# Patient Record
Sex: Male | Born: 1970 | Race: Black or African American | Hispanic: No | Marital: Married | Smoking: Former smoker
Health system: Southern US, Community
[De-identification: ages and names within clinical notes are randomized; demographics above are authoritative.]

## PROBLEM LIST (undated history)

## (undated) DIAGNOSIS — G4730 Sleep apnea, unspecified: Secondary | ICD-10-CM

## (undated) DIAGNOSIS — F4310 Post-traumatic stress disorder, unspecified: Secondary | ICD-10-CM

## (undated) DIAGNOSIS — I517 Cardiomegaly: Secondary | ICD-10-CM

## (undated) DIAGNOSIS — S46009A Unspecified injury of muscle(s) and tendon(s) of the rotator cuff of unspecified shoulder, initial encounter: Secondary | ICD-10-CM

## (undated) DIAGNOSIS — F419 Anxiety disorder, unspecified: Secondary | ICD-10-CM

---

## 2011-08-08 ENCOUNTER — Other Ambulatory Visit: Payer: Self-pay | Admitting: Family Medicine

## 2011-08-08 DIAGNOSIS — M25511 Pain in right shoulder: Secondary | ICD-10-CM

## 2011-08-15 ENCOUNTER — Ambulatory Visit
Admission: RE | Admit: 2011-08-15 | Discharge: 2011-08-15 | Disposition: A | Discharge status: Home / Self Care | Payer: Non-veteran care | Source: Ambulatory Visit | Attending: Family Medicine | Admitting: Family Medicine

## 2011-08-15 DIAGNOSIS — M25511 Pain in right shoulder: Secondary | ICD-10-CM

## 2020-07-10 ENCOUNTER — Emergency Department (HOSPITAL_BASED_OUTPATIENT_CLINIC_OR_DEPARTMENT_OTHER): Payer: No Typology Code available for payment source

## 2020-07-10 ENCOUNTER — Encounter (HOSPITAL_BASED_OUTPATIENT_CLINIC_OR_DEPARTMENT_OTHER): Payer: Self-pay | Admitting: Emergency Medicine

## 2020-07-10 ENCOUNTER — Other Ambulatory Visit: Payer: Self-pay

## 2020-07-10 ENCOUNTER — Inpatient Hospital Stay (HOSPITAL_BASED_OUTPATIENT_CLINIC_OR_DEPARTMENT_OTHER)
Admission: EM | Admit: 2020-07-10 | Discharge: 2020-07-12 | DRG: 247 | Disposition: A | Discharge status: Home / Self Care | Payer: No Typology Code available for payment source | Attending: Internal Medicine | Admitting: Internal Medicine

## 2020-07-10 DIAGNOSIS — R030 Elevated blood-pressure reading, without diagnosis of hypertension: Secondary | ICD-10-CM

## 2020-07-10 DIAGNOSIS — Z20822 Contact with and (suspected) exposure to covid-19: Secondary | ICD-10-CM | POA: Diagnosis not present

## 2020-07-10 DIAGNOSIS — R079 Chest pain, unspecified: Secondary | ICD-10-CM | POA: Diagnosis not present

## 2020-07-10 DIAGNOSIS — I214 Non-ST elevation (NSTEMI) myocardial infarction: Secondary | ICD-10-CM | POA: Diagnosis not present

## 2020-07-10 DIAGNOSIS — F17290 Nicotine dependence, other tobacco product, uncomplicated: Secondary | ICD-10-CM | POA: Diagnosis present

## 2020-07-10 DIAGNOSIS — E785 Hyperlipidemia, unspecified: Secondary | ICD-10-CM | POA: Diagnosis present

## 2020-07-10 DIAGNOSIS — E669 Obesity, unspecified: Secondary | ICD-10-CM | POA: Diagnosis not present

## 2020-07-10 DIAGNOSIS — F419 Anxiety disorder, unspecified: Secondary | ICD-10-CM | POA: Diagnosis present

## 2020-07-10 DIAGNOSIS — E876 Hypokalemia: Secondary | ICD-10-CM | POA: Diagnosis not present

## 2020-07-10 DIAGNOSIS — Z955 Presence of coronary angioplasty implant and graft: Secondary | ICD-10-CM

## 2020-07-10 DIAGNOSIS — I10 Essential (primary) hypertension: Secondary | ICD-10-CM

## 2020-07-10 DIAGNOSIS — I119 Hypertensive heart disease without heart failure: Secondary | ICD-10-CM | POA: Diagnosis present

## 2020-07-10 DIAGNOSIS — Z8249 Family history of ischemic heart disease and other diseases of the circulatory system: Secondary | ICD-10-CM | POA: Diagnosis not present

## 2020-07-10 DIAGNOSIS — I2510 Atherosclerotic heart disease of native coronary artery without angina pectoris: Secondary | ICD-10-CM

## 2020-07-10 DIAGNOSIS — F4310 Post-traumatic stress disorder, unspecified: Secondary | ICD-10-CM | POA: Diagnosis present

## 2020-07-10 DIAGNOSIS — Z9989 Dependence on other enabling machines and devices: Secondary | ICD-10-CM

## 2020-07-10 DIAGNOSIS — I25110 Atherosclerotic heart disease of native coronary artery with unstable angina pectoris: Secondary | ICD-10-CM | POA: Diagnosis not present

## 2020-07-10 DIAGNOSIS — G4733 Obstructive sleep apnea (adult) (pediatric): Secondary | ICD-10-CM

## 2020-07-10 DIAGNOSIS — Z6841 Body Mass Index (BMI) 40.0 and over, adult: Secondary | ICD-10-CM | POA: Diagnosis not present

## 2020-07-10 DIAGNOSIS — R7303 Prediabetes: Secondary | ICD-10-CM | POA: Diagnosis present

## 2020-07-10 HISTORY — DX: Sleep apnea, unspecified: G47.30

## 2020-07-10 HISTORY — DX: Cardiomegaly: I51.7

## 2020-07-10 HISTORY — DX: Unspecified injury of muscle(s) and tendon(s) of the rotator cuff of unspecified shoulder, initial encounter: S46.009A

## 2020-07-10 HISTORY — DX: Post-traumatic stress disorder, unspecified: F43.10

## 2020-07-10 HISTORY — DX: Anxiety disorder, unspecified: F41.9

## 2020-07-10 LAB — BASIC METABOLIC PANEL
Anion gap: 9 (ref 5–15)
BUN: 10 mg/dL (ref 6–20)
CO2: 24 mmol/L (ref 22–32)
Calcium: 8.9 mg/dL (ref 8.9–10.3)
Chloride: 105 mmol/L (ref 98–111)
Creatinine, Ser: 1.13 mg/dL (ref 0.61–1.24)
GFR calc Af Amer: >60 mL/min (ref >60)
GFR calc non Af Amer: >60 mL/min (ref >60)
Glucose, Bld: 118 mg/dL — ABNORMAL HIGH (ref 70–99)
Potassium: 4.1 mmol/L (ref 3.5–5.1)
Sodium: 138 mmol/L (ref 135–145)

## 2020-07-10 LAB — TROPONIN I (HIGH SENSITIVITY)
Troponin I (High Sensitivity): 3197 ng/L (ref <18)
Troponin I (High Sensitivity): 4141 ng/L (ref <18)
Troponin I (High Sensitivity): 4979 ng/L (ref <18)
Troponin I (High Sensitivity): 5323 ng/L (ref <18)

## 2020-07-10 LAB — CBC
HCT: 47.9 % (ref 39.0–52.0)
Hemoglobin: 16.1 g/dL (ref 13.0–17.0)
MCH: 30.6 pg (ref 26.0–34.0)
MCHC: 33.6 g/dL (ref 30.0–36.0)
MCV: 90.9 fL (ref 80.0–100.0)
Platelets: 198 10*3/uL (ref 150–400)
RBC: 5.27 MIL/uL (ref 4.22–5.81)
RDW: 13.7 % (ref 11.5–15.5)
WBC: 10.2 10*3/uL (ref 4.0–10.5)
nRBC: 0 % (ref 0.0–0.2)

## 2020-07-10 LAB — HEPARIN LEVEL (UNFRACTIONATED): Heparin Unfractionated: 0.35 IU/mL (ref 0.30–0.70)

## 2020-07-10 LAB — SARS CORONAVIRUS 2 BY RT PCR (HOSPITAL ORDER, PERFORMED IN ~~LOC~~ HOSPITAL LAB): SARS Coronavirus 2: NEGATIVE

## 2020-07-10 MED ORDER — ATORVASTATIN CALCIUM 80 MG PO TABS
80.0000 mg | ORAL_TABLET | Freq: Every day | ORAL | Status: DC
  Administered 2020-07-10 – 2020-07-11 (×2): 80 mg via ORAL
  Filled 2020-07-10 (×2): qty 1

## 2020-07-10 MED ORDER — MORPHINE SULFATE (PF) 4 MG/ML IV SOLN
4.0000 mg | Freq: Once | INTRAVENOUS | Status: DC
  Filled 2020-07-10: qty 1

## 2020-07-10 MED ORDER — LORAZEPAM 1 MG PO TABS
1.0000 mg | ORAL_TABLET | Freq: Once | ORAL | Status: AC
  Administered 2020-07-10: 1 mg via ORAL
  Filled 2020-07-10: qty 1

## 2020-07-10 MED ORDER — LABETALOL HCL 5 MG/ML IV SOLN
10.0000 mg | INTRAVENOUS | Status: DC | PRN
  Administered 2020-07-10: 10 mg via INTRAVENOUS
  Filled 2020-07-10: qty 4

## 2020-07-10 MED ORDER — ASPIRIN 81 MG PO CHEW
324.0000 mg | CHEWABLE_TABLET | Freq: Once | ORAL | Status: AC
  Administered 2020-07-10: 324 mg via ORAL
  Filled 2020-07-10: qty 4

## 2020-07-10 MED ORDER — ONDANSETRON HCL 4 MG/2ML IJ SOLN: 4.0000 mg | Freq: Four times a day (QID) | INTRAMUSCULAR | Status: DC | PRN

## 2020-07-10 MED ORDER — HEPARIN BOLUS VIA INFUSION
4000.0000 [IU] | Freq: Once | INTRAVENOUS | Status: AC
  Administered 2020-07-10: 4000 [IU] via INTRAVENOUS

## 2020-07-10 MED ORDER — ASPIRIN EC 81 MG PO TBEC
81.0000 mg | DELAYED_RELEASE_TABLET | Freq: Every day | ORAL | Status: DC
  Administered 2020-07-11: 81 mg via ORAL
  Filled 2020-07-10: qty 1

## 2020-07-10 MED ORDER — LORAZEPAM 0.5 MG PO TABS
0.5000 mg | ORAL_TABLET | Freq: Two times a day (BID) | ORAL | Status: DC | PRN
  Administered 2020-07-11: 0.5 mg via ORAL
  Filled 2020-07-10: qty 1

## 2020-07-10 MED ORDER — ACETAMINOPHEN 325 MG PO TABS
650.0000 mg | ORAL_TABLET | Freq: Once | ORAL | Status: AC
  Administered 2020-07-10: 650 mg via ORAL
  Filled 2020-07-10: qty 2

## 2020-07-10 MED ORDER — ACETAMINOPHEN 325 MG PO TABS: 650.0000 mg | ORAL_TABLET | ORAL | Status: DC | PRN

## 2020-07-10 MED ORDER — BLISTEX MEDICATED EX OINT
TOPICAL_OINTMENT | CUTANEOUS | Status: AC
  Administered 2020-07-10: 1 via TOPICAL
  Filled 2020-07-10: qty 6.3

## 2020-07-10 MED ORDER — NITROGLYCERIN 0.4 MG SL SUBL
0.4000 mg | SUBLINGUAL_TABLET | SUBLINGUAL | Status: DC | PRN
  Administered 2020-07-10 (×3): 0.4 mg via SUBLINGUAL
  Filled 2020-07-10: qty 1

## 2020-07-10 MED ORDER — NITROGLYCERIN IN D5W 200-5 MCG/ML-% IV SOLN
0.0000 ug/min | INTRAVENOUS | Status: DC
  Administered 2020-07-10: 5 ug/min via INTRAVENOUS
  Filled 2020-07-10: qty 250

## 2020-07-10 MED ORDER — HEPARIN (PORCINE) 25000 UT/250ML-% IV SOLN
1500.0000 [IU]/h | INTRAVENOUS | Status: DC
  Administered 2020-07-10: 1300 [IU]/h via INTRAVENOUS
  Administered 2020-07-11: 1500 [IU]/h via INTRAVENOUS
  Filled 2020-07-10 (×2): qty 250

## 2020-07-10 MED ORDER — LABETALOL HCL 5 MG/ML IV SOLN: 10.0000 mg | Freq: Once | INTRAVENOUS | Status: DC

## 2020-07-10 MED ORDER — BLISTEX MEDICATED EX OINT: TOPICAL_OINTMENT | CUTANEOUS | Status: DC | PRN

## 2020-07-10 MED ORDER — CARVEDILOL 3.125 MG PO TABS
3.1250 mg | ORAL_TABLET | Freq: Two times a day (BID) | ORAL | Status: DC
  Administered 2020-07-11: 3.125 mg via ORAL
  Filled 2020-07-10: qty 1

## 2020-07-10 NOTE — Progress Notes (Signed)
ANTICOAGULATION CONSULT NOTE - Initial Consult  Pharmacy Consult for heparin Indication: chest pain/ACS  No Known Allergies  Patient Measurements: Height: 5\' 10"  (177.8 cm) Weight: 129.3 kg (285 lb) IBW/kg (Calculated) : 73 Heparin Dosing Weight: 102.7kg  Vital Signs: Temp: 98.6 F (37 C) (08/09 0832) Temp Source: Oral (08/09 0832) BP: 140/97 (08/09 1000) Pulse Rate: 44 (08/09 1000)  Labs: Recent Labs    07/10/20 0907  HGB 16.1  HCT 47.9  PLT 198  CREATININE 1.13  TROPONINIHS 3,197*    Estimated Creatinine Clearance: 105.6 mL/min (by C-G formula based on SCr of 1.13 mg/dL).   Medical History: Past Medical History:  Diagnosis Date  . Anxiety   . Enlarged heart   . PTSD (post-traumatic stress disorder)   . Rotator cuff injury   . Sleep apnea     Medications:  Infusions:  . heparin      Assessment: 48 yom presented to the ED with CP. Troponin elevated and now starting IV heparin. Baseline CBC is WNL. He is not on anticoagulation PTA.   Goal of Therapy:  Heparin level 0.3-0.7 units/ml Monitor platelets by anticoagulation protocol: Yes   Plan:  Heparin bolus 4000 units IV x 1 Heparin gtt 1300 units/hr Check a 6 hr heparin level Daily heparin level and CBC  Colin Stewart, 12-22-1978 07/10/2020,10:13 AM

## 2020-07-10 NOTE — Progress Notes (Signed)
Pt placed on cpap for the night. °

## 2020-07-10 NOTE — H&P (Signed)
ADMISSION HISTORY & PHYSICAL  Patient Name: Colin Stewart Date of Encounter: 07/10/2020 Primary Care Physician: [facility] Cardiologist: No primary care provider on file.  Chief Complaint   Chest pain  Patient Profile   49 yo male with "no medical problems" presents with several days of chest burning and pressure, found to have elevated troponin c/w NSTEMI  HPI   This is a 49 y.o. male  [place] with a past medical history significant for enlarged heart, PTSD and sleep apnea, presents with several days of progressive chest burning and pressure.  He reports has been having symptoms on and off possibly for months including left arm numbness as well as some numbness and burning in his legs particularly after he walks several blocks that improves after rest.  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  He said he was smoking a cigar at the time and put that down but had no instant relief from the symptoms.  Then it seemed to go away over the weekend and came back again irritated by smoking.  He then stopped with the cigars and returned to [place] but could not sleep and had continued chest pressure ultimately presented to med [facility].  He was evaluated there abnormal elevation in high-sensitivity troponin with values of 3197, 4141, 4979, and 5323.  He was started on IV heparin for presumed non-STEMI and cardiology was consulted.  Covid testing was negative, however he is unvaccinated.  He was also noted to be markedly hypertensive which she says is unusual for him.  Most recently blood pressure was one 160/106.  Labs were otherwise unremarkable.  Chest x-ray shows no acute cardiopulmonary disease.  EKG personally reviewed shows sinus rhythm at 68 with probable left atrial enlargement and no ischemic changes.  PMHx   Past Medical History:  Diagnosis Date  . Anxiety   . Enlarged heart   . PTSD (post-traumatic stress disorder)   .  Rotator cuff injury   . Sleep apnea     History reviewed. No pertinent surgical history.  FAMHx   Family History  Problem Relation Age of Onset  . CAD Mother   . Hypertension Father     SOCHx    reports that he has been smoking cigars. He has never used smokeless tobacco. He reports current alcohol use of about 6.0 standard drinks of alcohol per week. He reports current drug use. Frequency: 1.00 time per week. Drug: Marijuana.  Previously smoked cigarettes on a daily basis but quit about a year ago  Outpatient Medications   No current facility-administered medications on file prior to encounter.   No current outpatient medications on file prior to encounter.    Inpatient Medications    Scheduled Meds: .  morphine injection  4 mg Intravenous Once    Continuous Infusions: . heparin 1,300 Units/hr (07/10/20 1043)    PRN Meds: labetalol, lip balm, nitroGLYCERIN   ALLERGIES   No Known Allergies  ROS   Pertinent items noted in HPI and remainder of comprehensive ROS otherwise negative.  Vitals   Vitals:   07/10/20 1545 07/10/20 1607 07/10/20 1642 07/10/20 1735  BP: (!) 173/112 (!) 165/107 (!) 154/112 (!) 160/106  Pulse: 65  94 77  Resp: 16 15 16 16   Temp:    98.4 F (36.9 C)  TempSrc:    Oral  SpO2: 100%  99% 100%  Weight:    122.5 kg  Height:  5\' 10"  (1.778 m)    Intake/Output Summary (Last 24 hours) at 07/10/2020 2007 Last data filed at 07/10/2020 1800 Gross per 24 hour  Intake 132.63 ml  Output --  Net 132.63 ml   Filed Weights   07/10/20 09/09/20 07/10/20 1735  Weight: 129.3 kg 122.5 kg    Physical Exam   General appearance: alert, no distress and moderately obese Neck: no carotid bruit, no JVD and thyroid not enlarged, symmetric, no tenderness/mass/nodules Lungs: diminished breath sounds bilaterally and rhonchi bilaterally Heart: regular rate and rhythm Abdomen: soft, non-tender; bowel sounds normal; no masses,  no organomegaly Extremities:  extremities normal, atraumatic, no cyanosis or edema Pulses: 2+ and symmetric Skin: Skin color, texture, turgor normal. No rashes or lesions Neurologic: Grossly normal Psych: Pleasant  Labs   Results for orders placed or performed during the hospital encounter of 07/10/20 (from the past 48 hour(s))  Basic metabolic panel     Status: Abnormal   Collection Time: 07/10/20  9:07 AM  Result Value Ref Range   Sodium 138 135 - 145 mmol/L   Potassium 4.1 3.5 - 5.1 mmol/L   Chloride 105 98 - 111 mmol/L   CO2 24 22 - 32 mmol/L   Glucose, Bld 118 (H) 70 - 99 mg/dL    Comment: Glucose reference range applies only to samples taken after fasting for at least 8 hours.   BUN 10 6 - 20 mg/dL   Creatinine, Ser 09/09/20 0.61 - 1.24 mg/dL   Calcium 8.9 8.9 - 9.32 mg/dL   GFR calc non Af Amer >60 >60 mL/min   GFR calc Af Amer >60 >60 mL/min   Anion gap 9 5 - 15    Comment: Performed at [facility], [address] [place], [place]  CBC     Status: None   Collection Time: 07/10/20  9:07 AM  Result Value Ref Range   WBC 10.2 4.0 - 10.5 K/uL   RBC 5.27 4.22 - 5.81 MIL/uL   Hemoglobin 16.1 13.0 - 17.0 g/dL   HCT 09/09/20 39 - 52 %   MCV 90.9 80.0 - 100.0 fL   MCH 30.6 26.0 - 34.0 pg   MCHC 33.6 30.0 - 36.0 g/dL   RDW 99.8 33.8 - 25.0 %   Platelets 198 150 - 400 K/uL   nRBC 0.0 0.0 - 0.2 %    Comment: Performed at [facility], [address] [place], [place]  Troponin I (High Sensitivity)     Status: Abnormal   Collection Time: 07/10/20  9:07 AM  Result Value Ref Range   Troponin I (High Sensitivity) 3,197 (HH) <18 ng/L    Comment: CRITICAL RESULT CALLED TO, READ BACK BY AND VERIFIED WITH: CALLED TO C.REED RN AT 1000 ON 09/09/20 BY SROY (NOTE) Elevated high sensitivity troponin I (hsTnI) values and significant  changes across serial measurements may suggest ACS but many other  chronic and acute conditions are known to elevate hsTnI results.  Refer to the  Links section for chest pain algorithms and additional  guidance. Performed at [facility], [address] [place], [place]   Troponin I (High Sensitivity)     Status: Abnormal   Collection Time: 07/10/20 10:44 AM  Result Value Ref Range   Troponin I (High Sensitivity) 4,141 (HH) <18 ng/L    Comment: CRITICAL RESULT CALLED TO, READ BACK BY AND VERIFIED WITH: MARVA SIMMS RN @1129  07/10/2020 OLSONM DELTA  CHECK NOTED (NOTE) Elevated high sensitivity troponin I (hsTnI) values and significant  changes across serial measurements may suggest ACS but many other  chronic and acute conditions are known to elevate hsTnI results.  Refer to the Links section for chest pain algorithms and additional  guidance. Performed at [facility], [address] [place], [place] [address]   SARS Coronavirus 2 by RT PCR (hospital order, performed in [facility] hospital lab) Nasopharyngeal Nasopharyngeal Swab     Status: None   Collection Time: 07/10/20 10:44 AM   Specimen: Nasopharyngeal Swab  Result Value Ref Range   SARS Coronavirus 2 NEGATIVE NEGATIVE    Comment: (NOTE) SARS-CoV-2 target nucleic acids are NOT DETECTED.  The SARS-CoV-2 RNA is generally detectable in upper and lower respiratory specimens during the acute phase of infection. The lowest concentration of SARS-CoV-2 viral copies this assay can detect is 250 copies / mL. A negative result does not preclude SARS-CoV-2 infection and should not be used as the sole basis for treatment or other patient management decisions.  A negative result may occur with improper specimen collection / handling, submission of specimen other than nasopharyngeal swab, presence of viral mutation(s) within the areas targeted by this assay, and inadequate number of viral copies (<250 copies / mL). A negative result must be combined with clinical observations, patient history, and epidemiological information.  Fact Sheet for  Patients:   BoilerBrush.com.cy  Fact Sheet for Healthcare Providers: https://pope.com/  This test is not yet approved or  cleared by the [place] FDA and has been authorized for detection and/or diagnosis of SARS-CoV-2 by FDA under an Emergency Use Authorization (EUA).  This EUA will remain in effect (meaning this test can be used) for the duration of the COVID-19 declaration under Section 564(b)(1) of the Act, 21 U.S.C. section 360bbb-3(b)(1), unless the authorization is terminated or revoked sooner.  Performed at [facility], [address] [place], [place] [address]   Troponin I (High Sensitivity)     Status: Abnormal   Collection Time: 07/10/20  1:40 PM  Result Value Ref Range   Troponin I (High Sensitivity) 4,979 (HH) <18 ng/L    Comment: CRITICAL RESULT CALLED TO, READ BACK BY AND VERIFIED WITH: MARVA SIMMS RN @1421  07/10/2020 OLSONM DELTA CHECK NOTED (NOTE) Elevated high sensitivity troponin I (hsTnI) values and significant  changes across serial measurements may suggest ACS but many other  chronic and acute conditions are known to elevate hsTnI results.  Refer to the Links section for chest pain algorithms and additional  guidance. Performed at [facility], [address] [place], [place]   Troponin I (High Sensitivity)     Status: Abnormal   Collection Time: 07/10/20  3:18 PM  Result Value Ref Range   Troponin I (High Sensitivity) 5,323 (HH) <18 ng/L    Comment: CRITICAL RESULT CALLED TO, READ BACK BY AND VERIFIED WITH: MARVA SIMMS RN @1556  07/10/2020 OLSONM DELTA CHECK NOTED (NOTE) Elevated high sensitivity troponin I (hsTnI) values and significant  changes across serial measurements may suggest ACS but many other  chronic and acute conditions are known to elevate hsTnI results.  Refer to the Links section for chest pain algorithms and additional  guidance. Performed at [facility], [address] [place], [address] [place]   Heparin level (unfractionated)     Status: None   Collection Time: 07/10/20  4:26 PM  Result Value Ref Range  Heparin Unfractionated 0.35 0.30 - 0.70 IU/mL    Comment: (NOTE) If heparin results are below expected values, and patient dosage has  been confirmed, suggest follow up testing of antithrombin III levels. Performed at [facility], [address] [place], [place] [address]     ECG   Sinus rhythm at 68, possible left atrial enlargement- Personally Reviewed  Telemetry   Sinus rhythm- Personally Reviewed  Radiology   DG Chest 2 View  Result Date: 07/10/2020 CLINICAL DATA:  Chest pain EXAM: CHEST - 2 VIEW COMPARISON:  None. FINDINGS: The heart size and mediastinal contours are within normal limits. Both lungs are clear. The visualized skeletal structures are unremarkable. IMPRESSION: No active cardiopulmonary disease. Electronically Signed   By: Alcide CleverMark  Lukens M.D.   On: 07/10/2020 09:02    Cardiac Studies   None  Assessment   Active Problems:   NSTEMI (non-ST elevated myocardial infarction) (HCC)   Blood pressure elevated without history of HTN   OSA on CPAP   Plan   1. NSTEMI -Significant troponin elevation suggestive of non-ST  elevation MI.  His symptoms have been going on for several days.  At this point he reports 2 out of 10 burning chest pressure however his pain was relieved after nitroglycerin.  Recommend restarting nitroglycerin drip.  He is on IV heparin.  Continue aspirin, start beta-blocker and continue to monitor on telemetry.  We discussed definitive heart catheterization I recommend that tomorrow.  Keep n.p.o. after midnight.  2. Elevated blood pressure -He reports no history of hypertension or diagnosis of that.  This may be related to acute coronary syndrome.  As above start IV nitroglycerin for chest discomfort and elevated blood pressure as well as beta-blocker for  goal-directed therapy.  3. OSA on CPAP -Resume home CPAP per RT, he does not know his settings but reported mild apnea with an AHI of 2.5?  4.   Screening -Check fasting lipid profile hemoglobin A1c in the a.m. start high potency statin therapy for guidelines for ACS  Time Spent Directly with Patient:  I have spent a total of 45 minutes with patient reviewing hospital notes, telemetry, EKGs, labs and examining the patient as well as establishing an assessment and plan that was discussed with the patient.  > 50% of time was spent in direct patient care.   Length of Stay:  LOS: 0 days   Chrystie NoseKenneth C. Ulas Zuercher, MD, Citizens Medical CenterFACC, FACP  [place]  [facility]  Medical Director of the [facility]  [facility] Diplomate of the American Board of Clinical Lipidology Attending Cardiologist  Direct Dial: [phone]  Fax: [phone]  Website:  [email or web address]   Vika Buske C Idolina Mantell 07/10/2020, 8:07 PM

## 2020-07-10 NOTE — ED Triage Notes (Signed)
Intermittent chest pain since night before last.  Pt states constant since last night.  No sob.  Some nausea.  No vomiting.  Some left arm tingling for several months.

## 2020-07-10 NOTE — ED Notes (Signed)
Date and time results received: 07/10/20 1001   Test: trp Critical OVPCH:4035  Name of Provider Notified: Charm Barges Orders Received? Or Actions Taken?: no orders given

## 2020-07-10 NOTE — ED Provider Notes (Signed)
[facility] EMERGENCY DEPARTMENT Provider Note   CSN: [id number] Arrival date & time: 07/10/20  6503     History Chief Complaint  Patient presents with  . Chest Pain    Colin Stewart is a 49 y.o. male.  He is here with a complaint of substernal chest pressure.  Since its been going on for a few days on and off but more constant since last night.  He associates it with smoking cigars as it started with a burning feeling in his lungs.  Some nausea no vomiting.  No shortness of breath.  No diaphoresis.  Also notes his blood pressure is high but this is a new problem.  History of high blood pressure and heart issues in his family.  Follows with the [place].  The history is provided by the patient.  Chest Pain Pain location:  Substernal area Pain quality: pressure   Pain radiates to:  Does not radiate Pain severity:  Moderate Onset quality:  Gradual Timing:  Intermittent Progression:  Worsening Chronicity:  New Relieved by:  Nothing Worsened by:  Nothing Ineffective treatments:  None tried Associated symptoms: abdominal pain (subxiphoid), heartburn and numbness (intermittent arms)   Associated symptoms: no back pain, no fever, no shortness of breath and no syncope   Risk factors: male sex and smoking   Risk factors: no coronary artery disease and no diabetes mellitus     HPI: A 49 year old patient with a history of obesity presents for evaluation of chest pain. Initial onset of pain was more than 6 hours ago. The patient's chest pain is described as heaviness/pressure/tightness and is not worse with exertion. The patient complains of nausea. The patient's chest pain is middle- or left-sided, is not well-localized, is not sharp and does not radiate to the arms/jaw/neck. The patient denies diaphoresis. The patient has smoked in the past 90 days and has a family history of coronary artery disease in a first-degree relative with onset less than age 44. The patient has no history of  stroke, has no history of peripheral artery disease, denies any history of treated diabetes, is not hypertensive and has no history of hypercholesterolemia.   Past Medical History:  Diagnosis Date  . Anxiety   . Enlarged heart   . PTSD (post-traumatic stress disorder)   . Rotator cuff injury     There are no problems to display for this patient.   History reviewed. No pertinent surgical history.     No family history on file.  Social History   Tobacco Use  . Smoking status: Current Some Day Smoker    Types: Cigars  . Smokeless tobacco: Never Used  Vaping Use  . Vaping Use: Never used  Substance Use Topics  . Alcohol use: Not on file  . Drug use: Not on file    Home Medications Prior to Admission medications   Not on File    Allergies    Patient has no known allergies.  Review of Systems   Review of Systems  Constitutional: Negative for fever.  HENT: Negative for sore throat.   Eyes: Negative for visual disturbance.  Respiratory: Negative for shortness of breath.   Cardiovascular: Positive for chest pain. Negative for syncope.  Gastrointestinal: Positive for abdominal pain (subxiphoid) and heartburn.  Genitourinary: Negative for dysuria.  Musculoskeletal: Negative for back pain.  Skin: Negative for rash.  Neurological: Positive for numbness (intermittent arms).    Physical Exam Updated Vital Signs BP (!) 156/119   Pulse 74  Temp 98.6 F (37 C) (Oral)   Resp 16   Ht 5\' 10"  (1.778 m)   Wt 129.3 kg   SpO2 100%   BMI 40.89 kg/m   Physical Exam Vitals and nursing note reviewed.  Constitutional:      Appearance: Normal appearance. He is well-developed.  HENT:     Head: Normocephalic and atraumatic.  Eyes:     Conjunctiva/sclera: Conjunctivae normal.  Cardiovascular:     Rate and Rhythm: Normal rate and regular rhythm.     Heart sounds: No murmur heard.   Pulmonary:     Effort: Pulmonary effort is normal. No respiratory distress.     Breath  sounds: Normal breath sounds.  Abdominal:     Palpations: Abdomen is soft.     Tenderness: There is no abdominal tenderness.  Musculoskeletal:        General: Normal range of motion.     Cervical back: Neck supple.     Right lower leg: No edema.     Left lower leg: No edema.  Skin:    General: Skin is warm and dry.     Capillary Refill: Capillary refill takes less than 2 seconds.  Neurological:     General: No focal deficit present.     Mental Status: He is alert.     Sensory: No sensory deficit.     Motor: No weakness.     ED Results / Procedures / Treatments   Labs (all labs ordered are listed, but only abnormal results are displayed) Labs Reviewed  BASIC METABOLIC PANEL - Abnormal; Notable for the following components:      Result Value   Glucose, Bld 118 (*)    All other components within normal limits  TROPONIN I (HIGH SENSITIVITY) - Abnormal; Notable for the following components:   Troponin I (High Sensitivity) 3,197 (*)    All other components within normal limits  TROPONIN I (HIGH SENSITIVITY) - Abnormal; Notable for the following components:   Troponin I (High Sensitivity) 4,141 (*)    All other components within normal limits  TROPONIN I (HIGH SENSITIVITY) - Abnormal; Notable for the following components:   Troponin I (High Sensitivity) 4,979 (*)    All other components within normal limits  SARS CORONAVIRUS 2 BY RT PCR (HOSPITAL ORDER, PERFORMED IN [facility])  CBC  HEPARIN LEVEL (UNFRACTIONATED)  TROPONIN I (HIGH SENSITIVITY)    EKG EKG Interpretation  Date/Time:  Monday July 10 2020 08:32:17 EDT Ventricular Rate:  68 PR Interval:    QRS Duration: 102 QT Interval:  394 QTC Calculation: 419 R Axis:   25 Text Interpretation: Sinus rhythm Probable left atrial enlargement RSR' in V1 or V2, probably normal variant No old tracing to compare Confirmed by Meridee ScoreButler, Angelli Baruch [id number]) on 07/10/2020 8:42:45 AM   Radiology DG Chest 2 View  Result  Date: 07/10/2020 CLINICAL DATA:  Chest pain EXAM: CHEST - 2 VIEW COMPARISON:  None. FINDINGS: The heart size and mediastinal contours are within normal limits. Both lungs are clear. The visualized skeletal structures are unremarkable. IMPRESSION: No active cardiopulmonary disease. Electronically Signed   By: Alcide CleverMark  Lukens M.D.   On: 07/10/2020 09:02    Procedures .Critical Care Performed by: Terrilee FilesButler, Jelan Batterton C, MD Authorized by: Terrilee FilesButler, Liane Tribbey C, MD   Critical care provider statement:    Critical care time (minutes):  45   Critical care time was exclusive of:  Separately billable procedures and treating other patients   Critical care was necessary  to treat or prevent imminent or life-threatening deterioration of the following conditions:  Cardiac failure   Critical care was time spent personally by me on the following activities:  Discussions with consultants, evaluation of patient's response to treatment, examination of patient, ordering and performing treatments and interventions, ordering and review of laboratory studies, ordering and review of radiographic studies, pulse oximetry, re-evaluation of patient's condition, obtaining history from patient or surrogate, review of old charts and development of treatment plan with patient or surrogate   I assumed direction of critical care for this patient from another provider in my specialty: no     (including critical care time)  Medications Ordered in ED Medications  nitroGLYCERIN (NITROSTAT) SL tablet 0.4 mg (0.4 mg Sublingual Given 07/10/20 0920)  heparin ADULT infusion 100 units/mL (25000 units/212mL sodium chloride 0.45%) (1,300 Units/hr Intravenous New Bag/Given 07/10/20 1043)  lip balm (BLISTEX) ointment (1 application Topical Given 07/10/20 1127)  morphine 4 MG/ML injection 4 mg (4 mg Intravenous Not Given 07/10/20 1334)  aspirin chewable tablet 324 mg (324 mg Oral Given 07/10/20 0906)  acetaminophen (TYLENOL) tablet 650 mg (650 mg Oral Given 07/10/20  0926)  heparin bolus via infusion 4,000 Units (4,000 Units Intravenous Bolus from Bag 07/10/20 1043)  LORazepam (ATIVAN) tablet 1 mg (1 mg Oral Given 07/10/20 1331)    ED Course  I have reviewed the triage vital signs and the nursing notes.  Pertinent labs & imaging results that were available during my care of the patient were reviewed by me and considered in my medical decision making (see chart for details).  Clinical Course as of Jul 11 1447  Mon Jul 10, 2020  1017 Troponin markedly elevated at 3200.  Discussed with cardiology on-call Dr. Antoine Poche.  He will be admitting Dr. over at [facility] telemetry obs.  Agrees with current management of heparin and aspirin.  Patient updated on results and agreeable to plan.   [MB]  1056 Patient currently pain-free.  Very anxious about needing to be admitted.   [MB]    Clinical Course User Index [MB] Terrilee Files, MD   MDM Rules/Calculators/A&P HEAR Score: 4                       This patient complains of chest pain substernal burning in nature; this involves an extensive number of treatment Options and is a complaint that carries with it a high risk of complications and Morbidity. The differential includes ACS, vascular, PE, dissection, pneumothorax, pneumonia, reflux, musculoskeletal  I ordered, reviewed and interpreted labs, which included CBC with normal white count normal hemoglobin, chemistries normal other than mildly elevated glucose, troponin elevated and rising, Covid testing negative I ordered medication aspirin, sublingual nitroglycerin, IV heparin I ordered imaging studies which included chest x-ray and I independently    visualized and interpreted imaging which showed no acute disease Additional history obtained from patient's wife Previous records obtained and reviewed in epic, none I consulted cardiology Dr. Antoine Poche and discussed lab and imaging findings  Critical Interventions: Identification of NSTEMI and treatment with aspirin  IV heparin and sublingual nitroglycerin.  After the interventions stated above, I reevaluated the patient and found patient to be pain-free at this time.  He is complaining of some more chronic back and leg pain from sitting in the stretcher.  He understands he will need to be admitted to the hospital over at [facility] for cardiology evaluation and likely cardiac cath.   Final Clinical Impression(s) / ED  Diagnoses Final diagnoses:  NSTEMI (non-ST elevated myocardial infarction) [facility])    Rx / DC Orders ED Discharge Orders    None       Terrilee Files, MD 07/10/20 2100

## 2020-07-10 NOTE — Progress Notes (Signed)
ANTICOAGULATION CONSULT NOTE Pharmacy Consult for heparin Indication: chest pain/ACS  No Known Allergies  Patient Measurements: Height: 5\' 10"  (177.8 cm) Weight: 122.5 kg (270 lb 1.6 oz) IBW/kg (Calculated) : 73 Heparin Dosing Weight: 102.7kg  Vital Signs: Temp: 98.4 F (36.9 C) (08/09 1735) Temp Source: Oral (08/09 1735) BP: 160/106 (08/09 1735) Pulse Rate: 77 (08/09 1735)  Labs: Recent Labs    07/10/20 0907 07/10/20 0907 07/10/20 1044 07/10/20 1340 07/10/20 1518 07/10/20 1626  HGB 16.1  --   --   --   --   --   HCT 47.9  --   --   --   --   --   PLT 198  --   --   --   --   --   HEPARINUNFRC  --   --   --   --   --  0.35  CREATININE 1.13  --   --   --   --   --   TROPONINIHS 3,197*   < > 4,141* 4,979* 5,323*  --    < > = values in this interval not displayed.    Estimated Creatinine Clearance: 102.7 mL/min (by C-G formula based on SCr of 1.13 mg/dL).   Medical History: Past Medical History:  Diagnosis Date  . Anxiety   . Enlarged heart   . PTSD (post-traumatic stress disorder)   . Rotator cuff injury   . Sleep apnea     Medications:  Infusions:  . heparin 1,300 Units/hr (07/10/20 1043)    Assessment: 48 yom presented to the ED with CP. Troponin elevated and now starting IV heparin. Baseline CBC is WNL. He is not on anticoagulation PTA.   Initial level at goal (0.35) on 1300 units/hr. No bleeding or IV issues noted.   Goal of Therapy:  Heparin level 0.3-0.7 units/ml Monitor platelets by anticoagulation protocol: Yes   Plan:  Heparin gtt 1300 units/hr Daily heparin level and CBC  09/09/20 PharmD., BCPS Clinical Pharmacist 07/10/2020 6:11 PM

## 2020-07-10 NOTE — ED Notes (Signed)
Pt c/o headache

## 2020-07-10 NOTE — ED Notes (Signed)
Pt very anxious and tearful. Hx of PTSD. His wife is able to redirect pt thoughts. Pt ambulated around the department and agreeable to anti anxiety medication. EDP made aware. Also c/o back and leg pain from  Laying in bed but declined morphine at this time.

## 2020-07-11 ENCOUNTER — Inpatient Hospital Stay (HOSPITAL_COMMUNITY): Payer: No Typology Code available for payment source

## 2020-07-11 ENCOUNTER — Encounter (HOSPITAL_COMMUNITY)
Admission: EM | Disposition: A | Discharge status: Home / Self Care | Payer: Self-pay | Source: Home / Self Care | Attending: Internal Medicine

## 2020-07-11 DIAGNOSIS — I25110 Atherosclerotic heart disease of native coronary artery with unstable angina pectoris: Secondary | ICD-10-CM

## 2020-07-11 DIAGNOSIS — Z9989 Dependence on other enabling machines and devices: Secondary | ICD-10-CM | POA: Diagnosis not present

## 2020-07-11 DIAGNOSIS — R079 Chest pain, unspecified: Secondary | ICD-10-CM | POA: Diagnosis not present

## 2020-07-11 DIAGNOSIS — R030 Elevated blood-pressure reading, without diagnosis of hypertension: Secondary | ICD-10-CM | POA: Diagnosis not present

## 2020-07-11 DIAGNOSIS — G4733 Obstructive sleep apnea (adult) (pediatric): Secondary | ICD-10-CM | POA: Diagnosis not present

## 2020-07-11 DIAGNOSIS — I214 Non-ST elevation (NSTEMI) myocardial infarction: Secondary | ICD-10-CM | POA: Diagnosis not present

## 2020-07-11 HISTORY — PX: LEFT HEART CATH AND CORONARY ANGIOGRAPHY: CATH118249

## 2020-07-11 LAB — CBC
HCT: 45.1 % (ref 39.0–52.0)
Hemoglobin: 15.1 g/dL (ref 13.0–17.0)
MCH: 30.6 pg (ref 26.0–34.0)
MCHC: 33.5 g/dL (ref 30.0–36.0)
MCV: 91.5 fL (ref 80.0–100.0)
Platelets: 180 10*3/uL (ref 150–400)
RBC: 4.93 MIL/uL (ref 4.22–5.81)
RDW: 13.6 % (ref 11.5–15.5)
WBC: 10.1 10*3/uL (ref 4.0–10.5)
nRBC: 0 % (ref 0.0–0.2)

## 2020-07-11 LAB — HEPATIC FUNCTION PANEL
ALT: 29 U/L (ref 0–44)
AST: 80 U/L — ABNORMAL HIGH (ref 15–41)
Albumin: 3.8 g/dL (ref 3.5–5.0)
Alkaline Phosphatase: 42 U/L (ref 38–126)
Bilirubin, Direct: <0.1 mg/dL (ref 0.0–0.2)
Total Bilirubin: 0.9 mg/dL (ref 0.3–1.2)
Total Protein: 6.2 g/dL — ABNORMAL LOW (ref 6.5–8.1)

## 2020-07-11 LAB — ECHOCARDIOGRAM COMPLETE
Area-P 1/2: 3.42 cm2
Height: 70 in
S' Lateral: 3.4 cm
Weight: 4278.4 oz

## 2020-07-11 LAB — POCT ACTIVATED CLOTTING TIME
Activated Clotting Time: 301 seconds
Activated Clotting Time: 268 seconds
Activated Clotting Time: 274 seconds
Activated Clotting Time: 406 seconds
Activated Clotting Time: 290 seconds

## 2020-07-11 LAB — LIPID PANEL
Cholesterol: 237 mg/dL — ABNORMAL HIGH (ref 0–200)
HDL: 41 mg/dL (ref >40)
LDL Cholesterol: 183 mg/dL — ABNORMAL HIGH (ref 0–99)
Total CHOL/HDL Ratio: 5.8 RATIO
Triglycerides: 64 mg/dL (ref <150)
VLDL: 13 mg/dL (ref 0–40)

## 2020-07-11 LAB — HIV ANTIBODY (ROUTINE TESTING W REFLEX): HIV Screen 4th Generation wRfx: NONREACTIVE

## 2020-07-11 LAB — BASIC METABOLIC PANEL
Anion gap: 12 (ref 5–15)
BUN: 9 mg/dL (ref 6–20)
CO2: 23 mmol/L (ref 22–32)
Calcium: 8.9 mg/dL (ref 8.9–10.3)
Chloride: 105 mmol/L (ref 98–111)
Creatinine, Ser: 1.07 mg/dL (ref 0.61–1.24)
GFR calc Af Amer: >60 mL/min (ref >60)
GFR calc non Af Amer: >60 mL/min (ref >60)
Glucose, Bld: 107 mg/dL — ABNORMAL HIGH (ref 70–99)
Potassium: 3.3 mmol/L — ABNORMAL LOW (ref 3.5–5.1)
Sodium: 140 mmol/L (ref 135–145)

## 2020-07-11 LAB — HEMOGLOBIN A1C
Hgb A1c MFr Bld: 5.7 % — ABNORMAL HIGH (ref 4.8–5.6)
Mean Plasma Glucose: 116.89 mg/dL

## 2020-07-11 LAB — HEPARIN LEVEL (UNFRACTIONATED): Heparin Unfractionated: 0.2 IU/mL — ABNORMAL LOW (ref 0.30–0.70)

## 2020-07-11 SURGERY — LEFT HEART CATH AND CORONARY ANGIOGRAPHY
Anesthesia: LOCAL

## 2020-07-11 MED ORDER — ONDANSETRON HCL 4 MG/2ML IJ SOLN: 4.0000 mg | Freq: Four times a day (QID) | INTRAMUSCULAR | Status: DC | PRN

## 2020-07-11 MED ORDER — SODIUM CHLORIDE 0.9% FLUSH: 3.0000 mL | INTRAVENOUS | Status: DC | PRN

## 2020-07-11 MED ORDER — TICAGRELOR 90 MG PO TABS
90.0000 mg | ORAL_TABLET | Freq: Two times a day (BID) | ORAL | Status: DC
  Administered 2020-07-11 – 2020-07-12 (×2): 90 mg via ORAL
  Filled 2020-07-11 (×2): qty 1

## 2020-07-11 MED ORDER — SODIUM CHLORIDE 0.9 % WEIGHT BASED INFUSION: 3.0000 mL/kg/h | INTRAVENOUS | Status: DC

## 2020-07-11 MED ORDER — HEPARIN SODIUM (PORCINE) 1000 UNIT/ML IJ SOLN
INTRAMUSCULAR | Status: DC | PRN
  Administered 2020-07-11: 7000 [IU] via INTRAVENOUS
  Administered 2020-07-11: 5000 [IU] via INTRAVENOUS
  Administered 2020-07-11 (×2): 2500 [IU] via INTRAVENOUS

## 2020-07-11 MED ORDER — CARVEDILOL 3.125 MG PO TABS
3.1250 mg | ORAL_TABLET | Freq: Once | ORAL | Status: AC
  Administered 2020-07-11: 3.125 mg via ORAL
  Filled 2020-07-11: qty 1

## 2020-07-11 MED ORDER — SODIUM CHLORIDE 0.9 % IV SOLN: INTRAVENOUS | Status: AC

## 2020-07-11 MED ORDER — SODIUM CHLORIDE 0.9% FLUSH: 3.0000 mL | Freq: Two times a day (BID) | INTRAVENOUS | Status: DC

## 2020-07-11 MED ORDER — IOHEXOL 350 MG/ML SOLN
INTRAVENOUS | Status: DC | PRN
  Administered 2020-07-11: 300 mL

## 2020-07-11 MED ORDER — MIDAZOLAM HCL 2 MG/2ML IJ SOLN
INTRAMUSCULAR | Status: AC
  Filled 2020-07-11: qty 2

## 2020-07-11 MED ORDER — LABETALOL HCL 5 MG/ML IV SOLN: 10.0000 mg | INTRAVENOUS | Status: AC | PRN

## 2020-07-11 MED ORDER — POTASSIUM CHLORIDE CRYS ER 20 MEQ PO TBCR
40.0000 meq | EXTENDED_RELEASE_TABLET | Freq: Once | ORAL | Status: AC
  Administered 2020-07-11: 40 meq via ORAL
  Filled 2020-07-11: qty 2

## 2020-07-11 MED ORDER — SODIUM CHLORIDE 0.9 % IV SOLN: 250.0000 mL | INTRAVENOUS | Status: DC | PRN

## 2020-07-11 MED ORDER — ACETAMINOPHEN 325 MG PO TABS: 650.0000 mg | ORAL_TABLET | ORAL | Status: DC | PRN

## 2020-07-11 MED ORDER — NITROGLYCERIN 1 MG/10 ML FOR IR/CATH LAB
INTRA_ARTERIAL | Status: DC | PRN
  Administered 2020-07-11 (×2): 200 ug via INTRACORONARY

## 2020-07-11 MED ORDER — LIDOCAINE HCL (PF) 1 % IJ SOLN
INTRAMUSCULAR | Status: DC | PRN
  Administered 2020-07-11: 4 mL

## 2020-07-11 MED ORDER — MIDAZOLAM HCL 2 MG/2ML IJ SOLN
INTRAMUSCULAR | Status: DC | PRN
  Administered 2020-07-11 (×2): 1 mg via INTRAVENOUS

## 2020-07-11 MED ORDER — FENTANYL CITRATE (PF) 100 MCG/2ML IJ SOLN
INTRAMUSCULAR | Status: DC | PRN
  Administered 2020-07-11 (×3): 25 ug via INTRAVENOUS

## 2020-07-11 MED ORDER — SODIUM CHLORIDE 0.9 % WEIGHT BASED INFUSION
3.0000 mL/kg/h | INTRAVENOUS | Status: DC
  Administered 2020-07-11: 3 mL/kg/h via INTRAVENOUS

## 2020-07-11 MED ORDER — TICAGRELOR 90 MG PO TABS
ORAL_TABLET | ORAL | Status: DC | PRN
  Administered 2020-07-11: 180 mg via ORAL

## 2020-07-11 MED ORDER — VERAPAMIL HCL 2.5 MG/ML IV SOLN
INTRAVENOUS | Status: DC | PRN
  Administered 2020-07-11: 10 mL via INTRA_ARTERIAL

## 2020-07-11 MED ORDER — MIDAZOLAM HCL 2 MG/2ML IJ SOLN
INTRAMUSCULAR | Status: DC | PRN
  Administered 2020-07-11: 1 mg via INTRAVENOUS

## 2020-07-11 MED ORDER — HYDRALAZINE HCL 20 MG/ML IJ SOLN: 10.0000 mg | INTRAMUSCULAR | Status: AC | PRN

## 2020-07-11 MED ORDER — HEPARIN (PORCINE) IN NACL 1000-0.9 UT/500ML-% IV SOLN
INTRAVENOUS | Status: DC | PRN
  Administered 2020-07-11 (×2): 500 mL

## 2020-07-11 MED ORDER — TICAGRELOR 90 MG PO TABS
ORAL_TABLET | ORAL | Status: AC
  Filled 2020-07-11: qty 2

## 2020-07-11 MED ORDER — SODIUM CHLORIDE 0.9% FLUSH
3.0000 mL | Freq: Two times a day (BID) | INTRAVENOUS | Status: DC
  Administered 2020-07-11 (×2): 3 mL via INTRAVENOUS

## 2020-07-11 MED ORDER — NITROGLYCERIN 1 MG/10 ML FOR IR/CATH LAB
INTRA_ARTERIAL | Status: AC
  Filled 2020-07-11: qty 10

## 2020-07-11 MED ORDER — LIDOCAINE HCL (PF) 1 % IJ SOLN
INTRAMUSCULAR | Status: AC
  Filled 2020-07-11: qty 30

## 2020-07-11 MED ORDER — SODIUM CHLORIDE 0.9 % WEIGHT BASED INFUSION
1.0000 mL/kg/h | INTRAVENOUS | Status: DC
  Administered 2020-07-11 (×2): 1 mL/kg/h via INTRAVENOUS

## 2020-07-11 MED ORDER — FENTANYL CITRATE (PF) 100 MCG/2ML IJ SOLN
INTRAMUSCULAR | Status: AC
  Filled 2020-07-11: qty 2

## 2020-07-11 MED ORDER — VERAPAMIL HCL 2.5 MG/ML IV SOLN
INTRAVENOUS | Status: AC
  Filled 2020-07-11: qty 2

## 2020-07-11 MED ORDER — OXYCODONE HCL 5 MG PO TABS: 5.0000 mg | ORAL_TABLET | ORAL | Status: DC | PRN

## 2020-07-11 MED ORDER — HEPARIN SODIUM (PORCINE) 5000 UNIT/ML IJ SOLN: 5000.0000 [IU] | Freq: Three times a day (TID) | INTRAMUSCULAR | Status: DC

## 2020-07-11 MED ORDER — ASPIRIN 81 MG PO CHEW
81.0000 mg | CHEWABLE_TABLET | Freq: Every day | ORAL | Status: DC
  Administered 2020-07-12: 81 mg via ORAL
  Filled 2020-07-11: qty 1

## 2020-07-11 MED ORDER — HEPARIN (PORCINE) IN NACL 1000-0.9 UT/500ML-% IV SOLN
INTRAVENOUS | Status: AC
  Filled 2020-07-11: qty 1000

## 2020-07-11 MED ORDER — HEPARIN SODIUM (PORCINE) 1000 UNIT/ML IJ SOLN
INTRAMUSCULAR | Status: AC
  Filled 2020-07-11: qty 1

## 2020-07-11 MED ORDER — SODIUM CHLORIDE 0.9 % WEIGHT BASED INFUSION: 1.0000 mL/kg/h | INTRAVENOUS | Status: DC

## 2020-07-11 MED ORDER — CARVEDILOL 6.25 MG PO TABS
6.2500 mg | ORAL_TABLET | Freq: Two times a day (BID) | ORAL | Status: DC
  Administered 2020-07-12: 6.25 mg via ORAL
  Filled 2020-07-11: qty 1

## 2020-07-11 MED ORDER — TICAGRELOR 90 MG PO TABS: 90.0000 mg | ORAL_TABLET | Freq: Two times a day (BID) | ORAL | Status: DC

## 2020-07-11 SURGICAL SUPPLY — 21 items
BALLN SAPPHIRE 2.5X15 (BALLOONS) ×2
BALLN SAPPHIRE ~~LOC~~ 3.5X18 (BALLOONS) ×1 IMPLANT
BALLOON SAPPHIRE 2.5X15 (BALLOONS) IMPLANT
CATH 5FR JL3.5 JR4 ANG PIG MP (CATHETERS) ×1 IMPLANT
CATH TELEPORT (CATHETERS) ×1 IMPLANT
CATH TELESCOPE 6F GEC (CATHETERS) ×1 IMPLANT
CATH VISTA GUIDE 6FR XBLAD3.5 (CATHETERS) ×1 IMPLANT
DEVICE RAD COMP TR BAND LRG (VASCULAR PRODUCTS) ×1 IMPLANT
GLIDESHEATH SLEND A-KIT 6F 22G (SHEATH) ×1 IMPLANT
GUIDEWIRE INQWIRE 1.5J.035X260 (WIRE) IMPLANT
INQWIRE 1.5J .035X260CM (WIRE) ×2
KIT ENCORE 26 ADVANTAGE (KITS) ×1 IMPLANT
KIT HEART LEFT (KITS) ×2 IMPLANT
PACK CARDIAC CATHETERIZATION (CUSTOM PROCEDURE TRAY) ×2 IMPLANT
SHEATH PROBE COVER 6X72 (BAG) ×1 IMPLANT
STENT RESOLUTE ONYX 3.0X30 (Permanent Stent) ×1 IMPLANT
STENT RESOLUTE ONYX 3.0X8 (Permanent Stent) ×1 IMPLANT
TRANSDUCER W/STOPCOCK (MISCELLANEOUS) ×2 IMPLANT
TUBING CIL FLEX 10 FLL-RA (TUBING) ×2 IMPLANT
WIRE ASAHI PROWATER 180CM (WIRE) ×1 IMPLANT
WIRE ASAHI PROWATER 300CM (WIRE) ×1 IMPLANT

## 2020-07-11 NOTE — Progress Notes (Signed)
Progress Note  Patient Name: Colin Stewart Date of Encounter: 07/11/2020  Primary Cardiologist:  Chrystie Nose, MD  Subjective   No CP overnight, claustrophobic, asks about tests (reassured) Willing to take rx Says will not smoke again  Inpatient Medications    Scheduled Meds:  aspirin EC  81 mg Oral Daily   atorvastatin  80 mg Oral QHS   carvedilol  3.125 mg Oral BID WC   sodium chloride flush  3 mL Intravenous Q12H   Continuous Infusions:  sodium chloride     sodium chloride 1 mL/kg/hr (07/11/20 0814)   heparin 1,500 Units/hr (07/11/20 0412)   nitroGLYCERIN 5 mcg/min (07/11/20 0412)   PRN Meds: sodium chloride, acetaminophen, lip balm, LORazepam, ondansetron (ZOFRAN) IV, sodium chloride flush   Vital Signs    Vitals:   07/11/20 0036 07/11/20 0400 07/11/20 0424 07/11/20 0823  BP: (!) 147/87  (!) 161/106 (!) 146/105  Pulse: 86  70 73  Resp: 16  15 16   Temp: 98.1 F (36.7 C)  98.4 F (36.9 C) 98.4 F (36.9 C)  TempSrc: Oral  Oral Oral  SpO2: 100%  100% 97%  Weight:  121.3 kg    Height:        Intake/Output Summary (Last 24 hours) at 07/11/2020 0824 Last data filed at 07/11/2020 09/10/2020 Gross per 24 hour  Intake 272.18 ml  Output 1350 ml  Net -1077.82 ml   Filed Weights   07/10/20 0833 07/10/20 1735 07/11/20 0400  Weight: 129.3 kg 122.5 kg 121.3 kg   Last Weight  Most recent update: 07/11/2020  4:14 AM   Weight  121.3 kg (267 lb 6.4 oz)           Weight change:    Telemetry    SR, no sig ectopy - Personally Reviewed  ECG    08/09 ECG is SR, HR 80, no acute ischemic changes, no pathologic Q waves - Personally Reviewed  Physical Exam   General: Well developed, well nourished, male appearing in no acute distress. Head: Normocephalic, atraumatic.  Neck: Supple without bruits, JVD not elevated. Lungs:  Resp regular and unlabored, CTA. Heart: RRR, S1, S2, no S3, S4, or murmur; no rub. Abdomen: Soft, non-tender, non-distended with  normoactive bowel sounds. No hepatomegaly. No rebound/guarding. No obvious abdominal masses. Extremities: No clubbing, cyanosis, no edema. Distal pedal pulses are 2+ bilaterally. Neuro: Alert and oriented X 3. Moves all extremities spontaneously. Psych: Normal affect.  Labs    Hematology Recent Labs  Lab 07/10/20 0907 07/11/20 0204  WBC 10.2 10.1  RBC 5.27 4.93  HGB 16.1 15.1  HCT 47.9 45.1  MCV 90.9 91.5  MCH 30.6 30.6  MCHC 33.6 33.5  RDW 13.7 13.6  PLT 198 180    Chemistry Recent Labs  Lab 07/10/20 0907 07/11/20 0204  NA 138 140  K 4.1 3.3*  CL 105 105  CO2 24 23  GLUCOSE 118* 107*  BUN 10 9  CREATININE 1.13 1.07  CALCIUM 8.9 8.9  GFRNONAA >60 >60  GFRAA >60 >60  ANIONGAP 9 12    No results found for: ALT, AST, GGT, ALKPHOS, BILITOT   High Sensitivity Troponin:   Recent Labs  Lab 07/10/20 0907 07/10/20 1044 07/10/20 1340 07/10/20 1518  TROPONINIHS 3,197* 4,141* 4,979* 5,323*     Lab Results  Component Value Date   CHOL 237 (H) 07/11/2020   HDL 41 07/11/2020   LDLCALC 183 (H) 07/11/2020   TRIG 64 07/11/2020   CHOLHDL 5.8 07/11/2020  BNPNo results for input(s): BNP, PROBNP in the last 168 hours.   DDimer No results for input(s): DDIMER in the last 168 hours.   Radiology    DG Chest 2 View  Result Date: 07/10/2020 CLINICAL DATA:  Chest pain EXAM: CHEST - 2 VIEW COMPARISON:  None. FINDINGS: The heart size and mediastinal contours are within normal limits. Both lungs are clear. The visualized skeletal structures are unremarkable. IMPRESSION: No active cardiopulmonary disease. Electronically Signed   By: Alcide Clever M.D.   On: 07/10/2020 09:02     Cardiac Studies   ECHO and CARDIAC CATH ordered   Patient Profile     49 y.o. male Vet w/ hx cardiac enlargement, HTN, PTSD, OSA was admitted 08/09 w/ CP >>NSTEMI by enzymes, and HTN.  Assessment & Plan    1. NSTEMI:  - on ASA, heparin, IV Nitro, high-dose statin, low-dose BB  2.  Hypokalemia - supp K+  3. HTN:  - Increase Coreg to 6.25 mg bid  4. HLD - Continue high-dose statin, ck LFTs  Active Problems:   NSTEMI (non-ST elevated myocardial infarction) (HCC)   Blood pressure elevated without history of HTN   OSA on CPAP    Melida Quitter , PA-C 8:24 AM 07/11/2020 Pager: [phone]

## 2020-07-11 NOTE — Interval H&P Note (Signed)
Cath Lab Visit (complete for each Cath Lab visit)  Clinical Evaluation Leading to the Procedure:   ACS: Yes.    Non-ACS:    Anginal Classification: CCS III  Anti-ischemic medical therapy: Minimal Therapy (1 class of medications)  Non-Invasive Test Results: No non-invasive testing performed  Prior CABG: No previous CABG      History and Physical Interval Note:  07/11/2020 2:20 PM  Colin Stewart  has presented today for surgery, with the diagnosis of nstemi.  The various methods of treatment have been discussed with the patient and family. After consideration of risks, benefits and other options for treatment, the patient has consented to  Procedure(s): LEFT HEART CATH AND CORONARY ANGIOGRAPHY (N/A) as a surgical intervention.  The patient's history has been reviewed, patient examined, no change in status, stable for surgery.  I have reviewed the patient's chart and labs.  Questions were answered to the patient's satisfaction.     Lyn Records III

## 2020-07-11 NOTE — Progress Notes (Signed)
Mr. Mikes family member handed me his black smart phone, which I handed to Mr.Brabec in the holding area.

## 2020-07-11 NOTE — Progress Notes (Signed)
  Echocardiogram 2D Echocardiogram has been performed.  Colin Stewart 07/11/2020, 12:02 PM

## 2020-07-11 NOTE — Progress Notes (Signed)
ANTICOAGULATION CONSULT NOTE Pharmacy Consult for heparin Indication: chest pain/ACS  Assessment: 48 yom presented to the ED with CP. Troponin elevated and now starting IV heparin. Baseline CBC is WNL. He is not on anticoagulation PTA.   Heparin level this am 0.2 units/hr  No issues with infusion, no bleeding noted  Goal of Therapy:  Heparin level 0.3-0.7 units/ml Monitor platelets by anticoagulation protocol: Yes   Plan:  Increase heparin gtt 1500 units/hr Check heparin level in 6 hours Daily heparin level and CBC  Thanks for allowing pharmacy to be a part of this patient's care.  Talbert Cage, PharmD Clinical Pharmacist 07/11/2020 4:04 AM

## 2020-07-11 NOTE — CV Procedure (Signed)
   Indication: Non-ST elevation MI  Diagnostic coronary angiography, left ventriculography, and PCI of mid LAD with overlapping stents via right radial access using real-time vascular ultrasound for guidance.  PCI was very complicated related to angle of origin of LAD from left main, poor guide catheter support, difficult wiring due to tortuosity and angulation proximal to the lesion, and requirement for guide catheter extension to complete the PCI.  Nondominant right coronary totally occluded.  Appears recent.  Widely patent left main  Widely patent proximal to mid LAD with subsequent segmental eccentric 85% stenosis beyond significant tortuosity.  Dominant circumflex with out significant proximal mid or distal obstruction.  Complicated PCI mid LAD requiring Guide-liner, microcatheter to allow negotiation of stenosis in LAD beyond acute angulation, and ultimate overlapping stent implantation with a 30 x 3.0 and 8 x 3.0 Onyx postdilated to 3.5 mm.  Post stenting less than 20% stenosis noted.

## 2020-07-11 NOTE — H&P (View-Only) (Signed)
Progress Note  Patient Name: Colin Stewart Date of Encounter: 07/11/2020  Primary Cardiologist:  Chrystie Nose, MD  Subjective   No CP overnight, claustrophobic, asks about tests (reassured) Willing to take rx Says will not smoke again  Inpatient Medications    Scheduled Meds:  aspirin EC  81 mg Oral Daily   atorvastatin  80 mg Oral QHS   carvedilol  3.125 mg Oral BID WC   sodium chloride flush  3 mL Intravenous Q12H   Continuous Infusions:  sodium chloride     sodium chloride 1 mL/kg/hr (07/11/20 0814)   heparin 1,500 Units/hr (07/11/20 0412)   nitroGLYCERIN 5 mcg/min (07/11/20 0412)   PRN Meds: sodium chloride, acetaminophen, lip balm, LORazepam, ondansetron (ZOFRAN) IV, sodium chloride flush   Vital Signs    Vitals:   07/11/20 0036 07/11/20 0400 07/11/20 0424 07/11/20 0823  BP: (!) 147/87  (!) 161/106 (!) 146/105  Pulse: 86  70 73  Resp: 16  15 16   Temp: 98.1 F (36.7 C)  98.4 F (36.9 C) 98.4 F (36.9 C)  TempSrc: Oral  Oral Oral  SpO2: 100%  100% 97%  Weight:  121.3 kg    Height:        Intake/Output Summary (Last 24 hours) at 07/11/2020 0824 Last data filed at 07/11/2020 09/10/2020 Gross per 24 hour  Intake 272.18 ml  Output 1350 ml  Net -1077.82 ml   Filed Weights   07/10/20 0833 07/10/20 1735 07/11/20 0400  Weight: 129.3 kg 122.5 kg 121.3 kg   Last Weight  Most recent update: 07/11/2020  4:14 AM   Weight  121.3 kg (267 lb 6.4 oz)           Weight change:    Telemetry    SR, no sig ectopy - Personally Reviewed  ECG    08/09 ECG is SR, HR 80, no acute ischemic changes, no pathologic Q waves - Personally Reviewed  Physical Exam   General: Well developed, well nourished, male appearing in no acute distress. Head: Normocephalic, atraumatic.  Neck: Supple without bruits, JVD not elevated. Lungs:  Resp regular and unlabored, CTA. Heart: RRR, S1, S2, no S3, S4, or murmur; no rub. Abdomen: Soft, non-tender, non-distended with  normoactive bowel sounds. No hepatomegaly. No rebound/guarding. No obvious abdominal masses. Extremities: No clubbing, cyanosis, no edema. Distal pedal pulses are 2+ bilaterally. Neuro: Alert and oriented X 3. Moves all extremities spontaneously. Psych: Normal affect.  Labs    Hematology Recent Labs  Lab 07/10/20 0907 07/11/20 0204  WBC 10.2 10.1  RBC 5.27 4.93  HGB 16.1 15.1  HCT 47.9 45.1  MCV 90.9 91.5  MCH 30.6 30.6  MCHC 33.6 33.5  RDW 13.7 13.6  PLT 198 180    Chemistry Recent Labs  Lab 07/10/20 0907 07/11/20 0204  NA 138 140  K 4.1 3.3*  CL 105 105  CO2 24 23  GLUCOSE 118* 107*  BUN 10 9  CREATININE 1.13 1.07  CALCIUM 8.9 8.9  GFRNONAA >60 >60  GFRAA >60 >60  ANIONGAP 9 12    No results found for: ALT, AST, GGT, ALKPHOS, BILITOT   High Sensitivity Troponin:   Recent Labs  Lab 07/10/20 0907 07/10/20 1044 07/10/20 1340 07/10/20 1518  TROPONINIHS 3,197* 4,141* 4,979* 5,323*     Lab Results  Component Value Date   CHOL 237 (H) 07/11/2020   HDL 41 07/11/2020   LDLCALC 183 (H) 07/11/2020   TRIG 64 07/11/2020   CHOLHDL 5.8 07/11/2020  BNPNo results for input(s): BNP, PROBNP in the last 168 hours.  ° °DDimer No results for input(s): DDIMER in the last 168 hours.  ° °Radiology  °  °DG Chest 2 View ° °Result Date: 07/10/2020 °CLINICAL DATA:  Chest pain EXAM: CHEST - 2 VIEW COMPARISON:  None. FINDINGS: The heart size and mediastinal contours are within normal limits. Both lungs are clear. The visualized skeletal structures are unremarkable. IMPRESSION: No active cardiopulmonary disease. Electronically Signed   By: Mark  Lukens M.D.   On: 07/10/2020 09:02  ° ° ° °Cardiac Studies  ° °ECHO and CARDIAC CATH ordered  ° °Patient Profile  °   °49 y.o. male Vet w/ hx cardiac enlargement, HTN, PTSD, OSA was admitted 08/09 w/ CP >>NSTEMI by enzymes, and HTN. ° °Assessment & Plan  °  °1. NSTEMI:  °- on ASA, heparin, IV Nitro, high-dose statin, low-dose BB ° °2.  Hypokalemia °- supp K+ ° °3. HTN:  °- Increase Coreg to 6.25 mg bid ° °4. HLD °- Continue high-dose statin, ck LFTs ° °Active Problems: °  NSTEMI (non-ST elevated myocardial infarction) (HCC) °  Blood pressure elevated without history of HTN °  OSA on CPAP ° ° ° °Signed, °Leyani Gargus , PA-C °8:24 AM °07/11/2020 °Pager: [phone] ° °

## 2020-07-12 ENCOUNTER — Encounter (HOSPITAL_COMMUNITY): Payer: Self-pay | Admitting: Interventional Cardiology

## 2020-07-12 DIAGNOSIS — R7303 Prediabetes: Secondary | ICD-10-CM

## 2020-07-12 DIAGNOSIS — R030 Elevated blood-pressure reading, without diagnosis of hypertension: Secondary | ICD-10-CM | POA: Diagnosis not present

## 2020-07-12 DIAGNOSIS — Z9989 Dependence on other enabling machines and devices: Secondary | ICD-10-CM | POA: Diagnosis not present

## 2020-07-12 DIAGNOSIS — I10 Essential (primary) hypertension: Secondary | ICD-10-CM

## 2020-07-12 DIAGNOSIS — I2510 Atherosclerotic heart disease of native coronary artery without angina pectoris: Secondary | ICD-10-CM

## 2020-07-12 DIAGNOSIS — E785 Hyperlipidemia, unspecified: Secondary | ICD-10-CM

## 2020-07-12 DIAGNOSIS — I214 Non-ST elevation (NSTEMI) myocardial infarction: Secondary | ICD-10-CM | POA: Diagnosis not present

## 2020-07-12 DIAGNOSIS — G4733 Obstructive sleep apnea (adult) (pediatric): Secondary | ICD-10-CM | POA: Diagnosis not present

## 2020-07-12 LAB — CBC
HCT: 45 % (ref 39.0–52.0)
Hemoglobin: 14.7 g/dL (ref 13.0–17.0)
MCH: 30.1 pg (ref 26.0–34.0)
MCHC: 32.7 g/dL (ref 30.0–36.0)
MCV: 92 fL (ref 80.0–100.0)
Platelets: 162 10*3/uL (ref 150–400)
RBC: 4.89 MIL/uL (ref 4.22–5.81)
RDW: 13.5 % (ref 11.5–15.5)
WBC: 8.9 10*3/uL (ref 4.0–10.5)
nRBC: 0 % (ref 0.0–0.2)

## 2020-07-12 LAB — BASIC METABOLIC PANEL
Anion gap: 10 (ref 5–15)
BUN: 8 mg/dL (ref 6–20)
CO2: 23 mmol/L (ref 22–32)
Calcium: 8.9 mg/dL (ref 8.9–10.3)
Chloride: 106 mmol/L (ref 98–111)
Creatinine, Ser: 1 mg/dL (ref 0.61–1.24)
GFR calc Af Amer: >60 mL/min (ref >60)
GFR calc non Af Amer: >60 mL/min (ref >60)
Glucose, Bld: 105 mg/dL — ABNORMAL HIGH (ref 70–99)
Potassium: 3.9 mmol/L (ref 3.5–5.1)
Sodium: 139 mmol/L (ref 135–145)

## 2020-07-12 MED ORDER — ASPIRIN 81 MG PO CHEW: 81.0000 mg | CHEWABLE_TABLET | Freq: Every day | ORAL | Status: AC

## 2020-07-12 MED ORDER — ATORVASTATIN CALCIUM 80 MG PO TABS: 80.0000 mg | ORAL_TABLET | Freq: Every day | ORAL | 2 refills | Status: DC

## 2020-07-12 MED ORDER — TICAGRELOR 90 MG PO TABS: 90.0000 mg | ORAL_TABLET | Freq: Two times a day (BID) | ORAL | 11 refills | Status: DC

## 2020-07-12 MED ORDER — CARVEDILOL 12.5 MG PO TABS: 12.5000 mg | ORAL_TABLET | Freq: Two times a day (BID) | ORAL | Status: DC

## 2020-07-12 MED ORDER — NITROGLYCERIN 0.4 MG SL SUBL: 0.4000 mg | SUBLINGUAL_TABLET | SUBLINGUAL | 2 refills | Status: DC | PRN

## 2020-07-12 MED ORDER — CARVEDILOL 12.5 MG PO TABS: 12.5000 mg | ORAL_TABLET | Freq: Two times a day (BID) | ORAL | 2 refills | Status: DC

## 2020-07-12 MED ORDER — NITROGLYCERIN 0.4 MG SL SUBL
0.4000 mg | SUBLINGUAL_TABLET | SUBLINGUAL | 2 refills | Status: DC | PRN
Start: 2020-07-12 — End: 2023-04-16

## 2020-07-12 MED FILL — BRILINTA 90 MG TABLET: 90 | 30 days supply | Qty: 60 | Fill #0

## 2020-07-12 MED FILL — CARVEDILOL 12.5 MG TABLET: 12.5 | 30 days supply | Qty: 60 | Fill #0

## 2020-07-12 MED FILL — NITROGLYCERIN 0.4 MG TAB SL: 0.4 | 7 days supply | Qty: 25 | Fill #0

## 2020-07-12 MED FILL — ATORVASTATIN CALCIUM 80 MG: 80 | 30 days supply | Qty: 30 | Fill #0

## 2020-07-12 NOTE — Discharge Summary (Addendum)
Discharge Summary    Patient ID: Colin Stewart MRN: [id number]; DOB: 02-10-1971  Admit date: 07/10/2020 Discharge date: 07/12/2020  Primary Care Provider: [facility]  Primary Cardiologist: Chrystie Nose, MD  Primary Electrophysiologist:  None   Discharge Diagnoses    Principal Problem:   NSTEMI (non-ST elevated myocardial infarction) [facility]) Active Problems:   CAD (coronary artery disease)   Hypertension   Hyperlipidemia   Pre-diabetes   OSA on CPAP    Diagnostic Studies/Procedures    Echocardiogram 07/11/2020: Impressions: 1. Left ventricular ejection fraction, by estimation, is 60 to 65%. The  left ventricle has normal function. The left ventricle has no regional  wall motion abnormalities. Left ventricular diastolic parameters were  normal.  2. Right ventricular systolic function is normal. The right ventricular  size is normal. There is normal pulmonary artery systolic pressure.  3. Left atrial size was mildly dilated.  4. The mitral valve is normal in structure. No evidence of mitral valve  regurgitation. No evidence of mitral stenosis.  5. The aortic valve is normal in structure. Aortic valve regurgitation is  not visualized. No aortic stenosis is present.  6. The inferior vena cava is normal in size with greater than 50%  respiratory variability, suggesting right atrial pressure of 3 mmHg. _______________  Left Heart Catheterization 07/11/2020:  A stent was successfully placed.   Recent occlusion of nondominant right coronary. At the time of angiography the patient was asymptomatic and having no chest pain. This lesion was felt to represent an OAT (occluded artery trial) substrate and was not treated.  Widely patent left main, relatively short.  Large, transapical LAD with an acute angle origin from the left main and further tortuosity and angulation in the proximal and mid vessel. After an acute bend there is a segmental region of 85%  stenosis. The LAD then wraps around the left ventricular apex.  The circumflex is dominant and contains moderate luminal irregularities diffusely. The PDA arises from the distal circumflex. No collaterals are seen to fill the right coronary.  Overlapping 30 x 3.0 and 8 mm x 3.0 Onyx stents postdilated to 3.5 mm reduced the 85% stenosis to less than 20% with TIMI grade III flow.  Normal LV function with EF 65%. LVEDP normal at 16.  Recommendations:  Dual antiplatelet therapy for 1 year due to stent length and overlap. Aspirin and Brilinta is preferred for at least the initial 3 months. Downgrading to aspirin and clopidogrel could be a consideration thereafter.  Aggressive risk factor modification including checking hemoglobin A1c, aggressive blood pressure control, and LDL lowering to less than 70.  Eligible for discharge in a.m. if no recurrent symptoms.  Diagnostic Dominance: Left  Intervention     History of Present Illness     Colin Stewart is a 49 y.o. male with history of cardiac enlargement, hypertension, obstructive sleep apnea, PTSD who was admitted with NSTEMI on 07/10/2020.   Patient presented with several days of progressive chest burning and pressure. He noted similar symptoms on and off for months including left arm numbness as well as some numbness and burning in his legs particularly after walking several blocks that improves after rest. 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 He said he said he was smoking a cigar at the time and put that down but had no instant relief from his symptoms. Symptoms seemed to go away over the weekend and then came back again irritated by smoking. He then stopped  with the cigars and returned to [place] but could not sleep and continued to have chest pressure. Therefore, he decided to go to the [facility] ED for further evaluation.   In the ED, he was found to have elevated high-sensitivity  troponin of 3,197 >> 4,141 >> 4,979 >> 5,3232. EKG showed no acute ischemic changes. Otherwise, labs unremarkable. BP was elevated at 160/106. Chest x-ray showed no acute findings. He was started on IV Heparin and then admitted to [facility] for NSTEMI.  Hospital Course     Consultants: None  NSTEMI High-sensitivity troponin peaked at 5,323. Left heart catheterization showed recent occlusion of non-dominant RCA felt to represent occluded artery trial substrates (not treated), 85% stenosis of a tortuous LAD followed by a 0% stenosis. LAD treated with overlapping DES x2. Echo showed LVEF of 60-65% with normal wall motion and diastolic parameters. Patient tolerated procedure well. He has had no recurrent chest pain. Right radial cath site stable. Renal function stable. Able to ambulate without any problems. - Started on dual antiplatelet therapy with Aspirin 81mg  daily and Brilinta 90mg  twice daily.  - Started on beta-blocker and high-intensity statin. - Patient is very active and does not like to sit still. Discuss importance of no strenuous lifting or driving (he is a truck driver) for at least 1 week. Discussed gradually increasing physical activity and recommended Cardiac Rehab.  Hypertension BP mildly elevated throughout admission. Started on Coreg and dose up-titrated throughout admission.  - Will discharge on Coreg 12.5mg  twice daily.  Hyperlipidemia Lipid panel this admission: Total Cholesterol 237, Triglycerides 64, HDL 41, LDL 183.  LDL goal <70 given CAD. - Started on Lipitor 80mg  daily.  - AST elevated at 80 but otherwise LFTs normal. Will need to monitor this closely. Will need repeat lipid panel and LFTs in 6 weeks.  Pre-Diabetes Hemoglobin A1c 5.7 this admission. - Recommend lifestyle modifications.  Hypokalemia Potassium 3.3 on 07/11/2020. Repleted and 3.9 today.  Tobacco Abuse Importance of complete cessation discussed. Patient states he is going to quit smoking  cigars.  Patient seen and examined by Dr. today and determined to be stable for discharge. Outpatient follow-up has been arranged. Medications as below. Sent 1st month supply of medications to [facility] pharmacy. Patient follows at the [facility], [facility], so also went ahead and sent prescriptions there.  Did the patient have an acute coronary syndrome (MI, NSTEMI, STEMI, etc) this admission?:  Yes                               AHA/ACC Clinical Performance & Quality Measures: 7. Aspirin prescribed? - Yes 8. ADP Receptor Inhibitor (Plavix/Clopidogrel, Brilinta/Ticagrelor or Effient/Prasugrel) prescribed (includes medically managed patients)? - Yes 9. Beta Blocker prescribed? - Yes 10. High Intensity Statin (Lipitor 40-80mg  or Crestor 20-40mg ) prescribed? - Yes 11. EF assessed during THIS hospitalization? - Yes 12. For EF <40%, was ACEI/ARB prescribed? - Not Applicable (EF >/= 40%) 13. For EF <40%, Aldosterone Antagonist (Spironolactone or Eplerenone) prescribed? - Not Applicable (EF >/= 40%) 14. Cardiac Rehab Phase II ordered (including medically managed patients)? - Yes   _____________  Discharge Vitals Blood pressure (!) 145/90, pulse 76, temperature 98.4 F (36.9 C), temperature source Oral, resp. rate 20, height 5\' 10"  (1.778 m), weight 121.5 kg, SpO2 100 %.  Filed Weights   07/10/20 1735 07/11/20 0400 07/11/20 1808  Weight: 122.5 kg 121.3 kg 121.5 kg    Labs & Radiologic  Studies    CBC Recent Labs    07/11/20 0204 07/12/20 0529  WBC 10.1 8.9  HGB 15.1 14.7  HCT 45.1 45.0  MCV 91.5 92.0  PLT 180 162   Basic Metabolic Panel Recent Labs    79/39/03 0204 07/12/20 0529  NA 140 139  K 3.3* 3.9  CL 105 106  CO2 23 23  GLUCOSE 107* 105*  BUN 9 8  CREATININE 1.07 1.00  CALCIUM 8.9 8.9   Liver Function Tests Recent Labs    07/11/20 0204  AST 80*  ALT 29  ALKPHOS 42  BILITOT 0.9  PROT 6.2*  ALBUMIN 3.8   No results for input(s): LIPASE, AMYLASE in the last 72  hours. High Sensitivity Troponin:   Recent Labs  Lab 07/10/20 0907 07/10/20 1044 07/10/20 1340 07/10/20 1518  TROPONINIHS 3,197* 4,141* 4,979* 5,323*    BNP Invalid input(s): POCBNP D-Dimer No results for input(s): DDIMER in the last 72 hours. Hemoglobin A1C Recent Labs    07/11/20 0204  HGBA1C 5.7*   Fasting Lipid Panel Recent Labs    07/11/20 0204  CHOL 237*  HDL 41  LDLCALC 183*  TRIG 64  CHOLHDL 5.8   Thyroid Function Tests No results for input(s): TSH, T4TOTAL, T3FREE, THYROIDAB in the last 72 hours.  Invalid input(s): FREET3 _____________  DG Chest 2 View  Result Date: 07/10/2020 CLINICAL DATA:  Chest pain EXAM: CHEST - 2 VIEW COMPARISON:  None. FINDINGS: The heart size and mediastinal contours are within normal limits. Both lungs are clear. The visualized skeletal structures are unremarkable. IMPRESSION: No active cardiopulmonary disease. Electronically Signed   By: Alcide Clever M.D.   On: 07/10/2020 09:02   CARDIAC CATHETERIZATION  Result Date: 07/11/2020  A stent was successfully placed.   Recent occlusion of nondominant right coronary.  At the time of angiography the patient was asymptomatic and having no chest pain.  This lesion was felt to represent an OAT (occluded artery trial) substrate and was not treated.  Widely patent left main, relatively short.  Large, transapical LAD with an acute angle origin from the left main and further tortuosity and angulation in the proximal and mid vessel.  After an acute bend there is a segmental region of 85% stenosis.  The LAD then wraps around the left ventricular apex.  The circumflex is dominant and contains moderate luminal irregularities diffusely.  The PDA arises from the distal circumflex.  No collaterals are seen to fill the right coronary.  Overlapping 30 x 3.0 and 8 mm x 3.0 Onyx stents postdilated to 3.5 mm reduced the 85% stenosis to less than 20% with TIMI grade III flow.  Normal LV function with EF 65%.   LVEDP normal at 16. RECOMMENDATIONS:  Dual antiplatelet therapy for 1 year due to stent length and overlap.  Aspirin and Brilinta is preferred for at least the initial 3 months.  Downgrading to aspirin and clopidogrel could be a consideration thereafter.  Aggressive risk factor modification including checking hemoglobin A1c, aggressive blood pressure control, and LDL lowering to less than 70.  Eligible for discharge in a.m. if no recurrent symptoms.   ECHOCARDIOGRAM COMPLETE  Result Date: 07/11/2020    ECHOCARDIOGRAM REPORT   Patient Name:   Colin Stewart Date of Exam: 07/11/2020 Medical Rec #:  [id number]      Height:       70.0 in Accession #:    [id number]     Weight:       267.4 lb Date  of Birth:  1971/09/02      BSA:          2.362 m Patient Age:    48 years       BP:           146/105 mmHg Patient Gender: M              HR:           66 bpm. Exam Location:  Inpatient Procedure: 2D Echo and Intracardiac Opacification Agent Indications:    Chest Pain R07.9  History:        Patient has no prior history of Echocardiogram examinations.  Sonographer:    Thurman Coyer RDCS (AE) Referring Phys: [id number] Beatriz Stallion IMPRESSIONS  1. Left ventricular ejection fraction, by estimation, is 60 to 65%. The left ventricle has normal function. The left ventricle has no regional wall motion abnormalities. Left ventricular diastolic parameters were normal.  2. Right ventricular systolic function is normal. The right ventricular size is normal. There is normal pulmonary artery systolic pressure.  3. Left atrial size was mildly dilated.  4. The mitral valve is normal in structure. No evidence of mitral valve regurgitation. No evidence of mitral stenosis.  5. The aortic valve is normal in structure. Aortic valve regurgitation is not visualized. No aortic stenosis is present.  6. The inferior vena cava is normal in size with greater than 50% respiratory variability, suggesting right atrial pressure of 3 mmHg. FINDINGS   Left Ventricle: Left ventricular ejection fraction, by estimation, is 60 to 65%. The left ventricle has normal function. The left ventricle has no regional wall motion abnormalities. Definity contrast agent was given IV to delineate the left ventricular  endocardial borders. The left ventricular internal cavity size was normal in size. There is no left ventricular hypertrophy. Left ventricular diastolic parameters were normal. Normal left ventricular filling pressure. Right Ventricle: The right ventricular size is normal. No increase in right ventricular wall thickness. Right ventricular systolic function is normal. There is normal pulmonary artery systolic pressure. The tricuspid regurgitant velocity is 1.55 m/s, and  with an assumed right atrial pressure of 3 mmHg, the estimated right ventricular systolic pressure is 12.6 mmHg. Left Atrium: Left atrial size was mildly dilated. Right Atrium: Right atrial size was normal in size. Pericardium: There is no evidence of pericardial effusion. Mitral Valve: The mitral valve is normal in structure. Normal mobility of the mitral valve leaflets. No evidence of mitral valve regurgitation. No evidence of mitral valve stenosis. Tricuspid Valve: The tricuspid valve is normal in structure. Tricuspid valve regurgitation is trivial. No evidence of tricuspid stenosis. Aortic Valve: The aortic valve is normal in structure. Aortic valve regurgitation is not visualized. No aortic stenosis is present. Pulmonic Valve: The pulmonic valve was normal in structure. Pulmonic valve regurgitation is not visualized. No evidence of pulmonic stenosis. Aorta: The aortic root is normal in size and structure. Venous: The inferior vena cava is normal in size with greater than 50% respiratory variability, suggesting right atrial pressure of 3 mmHg. IAS/Shunts: No atrial level shunt detected by color flow Doppler.  LEFT VENTRICLE PLAX 2D LVIDd:         4.90 cm  Diastology LVIDs:         3.40 cm  LV e'  lateral:   11.20 cm/s LV PW:         1.10 cm  LV E/e' lateral: 5.0 LV IVS:        1.10 cm  LV  e' medial:    7.83 cm/s LVOT diam:     2.20 cm  LV E/e' medial:  7.2 LV SV:         91 LV SV Index:   39 LVOT Area:     3.80 cm  RIGHT VENTRICLE RV S prime:     7.83 cm/s TAPSE (M-mode): 1.7 cm LEFT ATRIUM             Index       RIGHT ATRIUM           Index LA diam:        4.20 cm 1.78 cm/m  RA Area:     19.70 cm LA Vol (A2C):   78.7 ml 33.33 ml/m RA Volume:   60.70 ml  25.70 ml/m LA Vol (A4C):   43.0 ml 18.21 ml/m LA Biplane Vol: 63.0 ml 26.68 ml/m  AORTIC VALVE LVOT Vmax:   123.00 cm/s LVOT Vmean:  84.900 cm/s LVOT VTI:    0.240 m  AORTA Ao Root diam: 3.50 cm MITRAL VALVE               TRICUSPID VALVE MV Area (PHT): 3.42 cm    TR Peak grad:   9.6 mmHg MV Decel Time: 222 msec    TR Vmax:        155.00 cm/s MV E velocity: 56.10 cm/s MV A velocity: 70.30 cm/s  SHUNTS MV E/A ratio:  0.80        Systemic VTI:  0.24 m                            Systemic Diam: 2.20 cm Rachelle HoraMihai Croitoru MD Electronically signed by Thurmon FairMihai Croitoru MD Signature Date/Time: 07/11/2020/1:20:23 PM    Final    Disposition   Patient is being discharged home today in good condition.  Follow-up Plans & Appointments     Follow-up Information    Azalee CourseMeng, Hao, [place] Follow up.   Specialties: Cardiology, Radiology Why: Hospital follow-up scheduled for 07/28/2020 at 9:15am with Azalee CourseHao Meng, one of Dr. Blanchie DessertHilty's PAs at our [facility] [place]. Please arrive 15 minute early for check-in. Contact information: [address] [place] [id number] [phone]              Discharge Instructions    Diet - low sodium heart healthy   Complete by: As directed    Increase activity slowly   Complete by: As directed       Discharge Medications   Allergies as of 07/12/2020   No Known Allergies     Medication List    TAKE these medications   aspirin 81 MG chewable tablet Chew 1 tablet (81 mg total) by mouth daily.     atorvastatin 80 MG tablet Commonly known as: LIPITOR Take 1 tablet (80 mg total) by mouth at bedtime.   carvedilol 12.5 MG tablet Commonly known as: COREG Take 1 tablet (12.5 mg total) by mouth 2 (two) times daily with a meal.   nitroGLYCERIN 0.4 MG SL tablet Commonly known as: Nitrostat Place 1 tablet (0.4 mg total) under the tongue every 5 (five) minutes as needed for chest pain.   ticagrelor 90 MG Tabs tablet Commonly known as: BRILINTA Take 1 tablet (90 mg total) by mouth 2 (two) times daily.          Outstanding Labs/Studies   N/A.  Duration of Discharge Encounter   Greater than 30  minutes including physician time.  Signed, Corrin Parker, PA-C 07/12/2020, 9:35 AM

## 2020-07-12 NOTE — Plan of Care (Signed)
  Problem: Education: Goal: Knowledge of General Education information will improve Description: Including pain rating scale, medication(s)/side effects and non-pharmacologic comfort measures Outcome: Adequate for Discharge   Problem: Health Behavior/Discharge Planning: Goal: Ability to manage health-related needs will improve Outcome: Adequate for Discharge   Problem: Clinical Measurements: Goal: Ability to maintain clinical measurements within normal limits will improve Outcome: Adequate for Discharge Goal: Cardiovascular complication will be avoided Outcome: Adequate for Discharge   Problem: Clinical Measurements: Goal: Will remain free from infection Outcome: Completed/Met Goal: Diagnostic test results will improve Outcome: Completed/Met Goal: Respiratory complications will improve Outcome: Completed/Met   Problem: Activity: Goal: Risk for activity intolerance will decrease Outcome: Completed/Met   Problem: Nutrition: Goal: Adequate nutrition will be maintained Outcome: Completed/Met   Problem: Coping: Goal: Level of anxiety will decrease Outcome: Completed/Met   Problem: Elimination: Goal: Will not experience complications related to bowel motility Outcome: Completed/Met Goal: Will not experience complications related to urinary retention Outcome: Completed/Met   Problem: Pain Managment: Goal: General experience of comfort will improve Outcome: Completed/Met   Problem: Safety: Goal: Ability to remain free from injury will improve Outcome: Completed/Met   Problem: Skin Integrity: Goal: Risk for impaired skin integrity will decrease Outcome: Completed/Met

## 2020-07-12 NOTE — Care Management (Signed)
[id number] 07-12-20 Patient presented for chest pain- post cardiac cath with intervention. Patient is on Brilinta. Patient is without insurance at this time. Patient is a veteran- Sports coach called [facility] to make them aware of arrival. Patient is a member of the [employer] is Dr. Ruthine Dose at [place]. PA has sent medications to the [facility] to make the provider aware of discharge medications. Patient will get his medications filled here at the [facility]-will be delivered to bedside. PA is aware that patient is without insurance-once he follows up in the cardiology office, the patient may need patient assistance with Brilinta if the [facility] does not have on formulary. Patient has transportation home, no further needs from Case Manager at this time. Graves-Bigelow, Lamar Laundry, RN, BSN Case Manager

## 2020-07-12 NOTE — Discharge Instructions (Signed)
Post NSTEMI: °NO HEAVY LIFTING X 2 WEEKS. °NO SEXUAL ACTIVITY X 2 WEEKS. °NO DRIVING X 1 WEEK. °NO SOAKING BATHS, HOT TUBS, POOLS, ETC., X 7 DAYS. ° °Radial Site Care: °Refer to this sheet in the next few weeks. These instructions provide you with information on caring for yourself after your procedure. Your caregiver may also give you more specific instructions. Your treatment has been planned according to current medical practices, but problems sometimes occur. Call your caregiver if you have any problems or questions after your procedure. °HOME CARE INSTRUCTIONS °· You may shower the day after the procedure. Remove the bandage (dressing) and gently wash the site with plain soap and water. Gently pat the site dry.  °· Do not apply powder or lotion to the site.  °· Do not submerge the affected site in water for 3 to 5 days.  °· Inspect the site at least twice daily.  °· Do not flex or bend the affected arm for 24 hours.  °· No lifting over 5 pounds (2.3 kg) for 5 days after your procedure.  °· Do not drive home if you are discharged the same day of the procedure. Have someone else drive you.  °What to expect: °· Any bruising will usually fade within 1 to 2 weeks.  °· Blood that collects in the tissue (hematoma) may be painful to the touch. It should usually decrease in size and tenderness within 1 to 2 weeks.  °SEEK IMMEDIATE MEDICAL CARE IF: °· You have unusual pain at the radial site.  °· You have redness, warmth, swelling, or pain at the radial site.  °· You have drainage (other than a small amount of blood on the dressing).  °· You have chills.  °· You have a fever or persistent symptoms for more than 72 hours.  °· You have a fever and your symptoms suddenly get worse.  °· Your arm becomes pale, cool, tingly, or numb.  °· You have heavy bleeding from the site. Hold pressure on the site.  ° ° °

## 2020-07-12 NOTE — Progress Notes (Signed)
CARDIAC REHAB PHASE I   PRE:  Rate/Rhythm: 80 SR    BP: sitting 131/97    SaO2:   MODE:  Ambulation: 500 ft   POST:  Rate/Rhythm: 102 ST    BP: sitting 157/101     SaO2:   Tolerated well, no c/o. BP elevated. In depth education with pt and wife, very receptive. He is eager to quit smoking. Understands the importance of Brilinta. Discussed MI, stent, restrictions, Brilinta, diet, smoking cessation, NTG, exercise and CRPII. Also answered many questions and concerns regarding Covid vaccine. 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  Pt is interested in participating in Virtual Cardiac and Pulmonary Rehab. Pt advised that Virtual Cardiac and Pulmonary Rehab is provided at no cost to the patient.  Checklist:  1. Pt has smart device  ie smartphone and/or ipad for downloading an app  Yes 2. Reliable internet/wifi service    Yes 3. Understands how to use their smartphone and navigate within an app.  Yes Pt verbalized understanding and is in agreement.  [id number]  Harriet Masson CES, ACSM 07/12/2020 9:36 AM

## 2020-07-12 NOTE — Progress Notes (Signed)
Progress Note  Patient Name: Colin Stewart Date of Encounter: 07/12/2020  [facility] Cardiologist: Colin NoseKenneth C Hilty, MD   Subjective   No acute overnight events. Patient up walking in his room this morning and is eager to go home. No recurrent chest discomfort. No shortness of breath.  Inpatient Medications    Scheduled Meds: . aspirin  81 mg Oral Daily  . atorvastatin  80 mg Oral QHS  . carvedilol  6.25 mg Oral BID WC  . heparin  5,000 Units Subcutaneous Q8H  . sodium chloride flush  3 mL Intravenous Q12H  . ticagrelor  90 mg Oral BID   Continuous Infusions: . sodium chloride    . nitroGLYCERIN 5 mcg/min (07/11/20 0412)   PRN Meds: sodium chloride, acetaminophen, lip balm, LORazepam, ondansetron (ZOFRAN) IV, oxyCODONE, sodium chloride flush   Vital Signs    Vitals:   07/11/20 1855 07/11/20 1910 07/11/20 1925 07/11/20 2049  BP: (!) 155/91 (!) 163/98 (!) 164/104 (!) 145/90  Pulse: 79 92 92 76  Resp:    20  Temp:    98.4 F (36.9 C)  TempSrc:    Oral  SpO2: 97% 96% 96% 100%  Weight:      Height:        Intake/Output Summary (Last 24 hours) at 07/12/2020 0727 Last data filed at 07/11/2020 1800 Gross per 24 hour  Intake 1259.26 ml  Output 1851 ml  Net -591.74 ml   Last 3 Weights 07/11/2020 07/11/2020 07/10/2020  Weight (lbs) 267 lb 12.8 oz 267 lb 6.4 oz 270 lb 1.6 oz  Weight (kg) 121.473 kg 121.292 kg 122.517 kg      Telemetry    Sinus rhythm with rates in the mid 70's to 90's. Briefly in the 120's earlier this morning around 2:00am (suspect this was with ambulation). - Personally Reviewed  ECG    Normal sinus rhythm, rate 89 bpm, with no acute ST/T changes. Normal axis. Normal PR and QRS interval. QTc 455 ms. - Personally Reviewed  Physical Exam   GEN: No acute distress.   Neck: Supple. Cardiac: RRR. No murmurs, rubs, or gallops. Right radial cath site soft with no signs of hematoma. Respiratory: Clear to auscultation bilaterally. GI: Soft,  non-tender, non-distended  MS: No lower extremity edema. No deformity. Skin: Warm and dry. Neuro:  No focal deficits. Psych: Normal affect. Responds appropriately.  Labs    High Sensitivity Troponin:   Recent Labs  Lab 07/10/20 0907 07/10/20 1044 07/10/20 1340 07/10/20 1518  TROPONINIHS 3,197* 4,141* 4,979* 5,323*      Chemistry Recent Labs  Lab 07/10/20 0907 07/11/20 0204 07/12/20 0529  NA 138 140 139  K 4.1 3.3* 3.9  CL 105 105 106  CO2 24 23 23   GLUCOSE 118* 107* 105*  BUN 10 9 8   CREATININE 1.13 1.07 1.00  CALCIUM 8.9 8.9 8.9  PROT  --  6.2*  --   ALBUMIN  --  3.8  --   AST  --  80*  --   ALT  --  29  --   ALKPHOS  --  42  --   BILITOT  --  0.9  --   GFRNONAA >60 >60 >60  GFRAA >60 >60 >60  ANIONGAP 9 12 10      Hematology Recent Labs  Lab 07/10/20 0907 07/11/20 0204 07/12/20 0529  WBC 10.2 10.1 8.9  RBC 5.27 4.93 4.89  HGB 16.1 15.1 14.7  HCT 47.9 45.1 45.0  MCV 90.9 91.5 92.0  MCH 30.6 30.6 30.1  MCHC 33.6 33.5 32.7  RDW 13.7 13.6 13.5  PLT 198 180 162    BNPNo results for input(s): BNP, PROBNP in the last 168 hours.   DDimer No results for input(s): DDIMER in the last 168 hours.   Radiology    DG Chest 2 View  Result Date: 07/10/2020 CLINICAL DATA:  Chest pain EXAM: CHEST - 2 VIEW COMPARISON:  None. FINDINGS: The heart size and mediastinal contours are within normal limits. Both lungs are clear. The visualized skeletal structures are unremarkable. IMPRESSION: No active cardiopulmonary disease. Electronically Signed   By: Colin Stewart M.D.   On: 07/10/2020 09:02   CARDIAC CATHETERIZATION  Result Date: 07/11/2020  A stent was successfully placed.   Recent occlusion of nondominant right coronary.  At the time of angiography the patient was asymptomatic and having no chest pain.  This lesion was felt to represent an OAT (occluded artery trial) substrate and was not treated.  Widely patent left main, relatively short.  Large, transapical LAD  with an acute angle origin from the left main and further tortuosity and angulation in the proximal and mid vessel.  After an acute bend there is a segmental region of 85% stenosis.  The LAD then wraps around the left ventricular apex.  The circumflex is dominant and contains moderate luminal irregularities diffusely.  The PDA arises from the distal circumflex.  No collaterals are seen to fill the right coronary.  Overlapping 30 x 3.0 and 8 mm x 3.0 Onyx stents postdilated to 3.5 mm reduced the 85% stenosis to less than 20% with TIMI grade III flow.  Normal LV function with EF 65%.  LVEDP normal at 16. RECOMMENDATIONS:  Dual antiplatelet therapy for 1 year due to stent length and overlap.  Aspirin and Brilinta is preferred for at least the initial 3 months.  Downgrading to aspirin and clopidogrel could be a consideration thereafter.  Aggressive risk factor modification including checking hemoglobin A1c, aggressive blood pressure control, and LDL lowering to less than 70.  Eligible for discharge in a.m. if no recurrent symptoms.   ECHOCARDIOGRAM COMPLETE  Result Date: 07/11/2020    ECHOCARDIOGRAM REPORT   Patient Name:   Colin Stewart Date of Exam: 07/11/2020 Medical Rec #:  [id number]      Height:       70.0 in Accession #:    [id number]     Weight:       267.4 lb Date of Birth:  [birth year]      BSA:          2.362 m Patient Age:    48 years       BP:           146/105 mmHg Patient Gender: M              HR:           66 bpm. Exam Location:  Inpatient Procedure: 2D Echo and Intracardiac Opacification Agent Indications:    Chest Pain R07.9  History:        Patient has no prior history of Echocardiogram examinations.  Sonographer:    Colin Stewart RDCS (AE) Referring Phys: [id number] Colin Stewart IMPRESSIONS  1. Left ventricular ejection fraction, by estimation, is 60 to 65%. The left ventricle has normal function. The left ventricle has no regional wall motion abnormalities. Left ventricular diastolic  parameters were normal.  2. Right ventricular systolic function is normal. The right ventricular size is normal. There  is normal pulmonary artery systolic pressure.  3. Left atrial size was mildly dilated.  4. The mitral valve is normal in structure. No evidence of mitral valve regurgitation. No evidence of mitral stenosis.  5. The aortic valve is normal in structure. Aortic valve regurgitation is not visualized. No aortic stenosis is present.  6. The inferior vena cava is normal in size with greater than 50% respiratory variability, suggesting right atrial pressure of 3 mmHg. FINDINGS  Left Ventricle: Left ventricular ejection fraction, by estimation, is 60 to 65%. The left ventricle has normal function. The left ventricle has no regional wall motion abnormalities. Definity contrast agent was given IV to delineate the left ventricular  endocardial borders. The left ventricular internal cavity size was normal in size. There is no left ventricular hypertrophy. Left ventricular diastolic parameters were normal. Normal left ventricular filling pressure. Right Ventricle: The right ventricular size is normal. No increase in right ventricular wall thickness. Right ventricular systolic function is normal. There is normal pulmonary artery systolic pressure. The tricuspid regurgitant velocity is 1.55 m/s, and  with an assumed right atrial pressure of 3 mmHg, the estimated right ventricular systolic pressure is 12.6 mmHg. Left Atrium: Left atrial size was mildly dilated. Right Atrium: Right atrial size was normal in size. Pericardium: There is no evidence of pericardial effusion. Mitral Valve: The mitral valve is normal in structure. Normal mobility of the mitral valve leaflets. No evidence of mitral valve regurgitation. No evidence of mitral valve stenosis. Tricuspid Valve: The tricuspid valve is normal in structure. Tricuspid valve regurgitation is trivial. No evidence of tricuspid stenosis. Aortic Valve: The aortic valve  is normal in structure. Aortic valve regurgitation is not visualized. No aortic stenosis is present. Pulmonic Valve: The pulmonic valve was normal in structure. Pulmonic valve regurgitation is not visualized. No evidence of pulmonic stenosis. Aorta: The aortic root is normal in size and structure. Venous: The inferior vena cava is normal in size with greater than 50% respiratory variability, suggesting right atrial pressure of 3 mmHg. IAS/Shunts: No atrial level shunt detected by color flow Doppler.  LEFT VENTRICLE PLAX 2D LVIDd:         4.90 cm  Diastology LVIDs:         3.40 cm  LV e' lateral:   11.20 cm/s LV PW:         1.10 cm  LV E/e' lateral: 5.0 LV IVS:        1.10 cm  LV e' medial:    7.83 cm/s LVOT diam:     2.20 cm  LV E/e' medial:  7.2 LV SV:         91 LV SV Index:   39 LVOT Area:     3.80 cm  RIGHT VENTRICLE RV S prime:     7.83 cm/s TAPSE (M-mode): 1.7 cm LEFT ATRIUM             Index       RIGHT ATRIUM           Index LA diam:        4.20 cm 1.78 cm/m  RA Area:     19.70 cm LA Vol (A2C):   78.7 ml 33.33 ml/m RA Volume:   60.70 ml  25.70 ml/m LA Vol (A4C):   43.0 ml 18.21 ml/m LA Biplane Vol: 63.0 ml 26.68 ml/m  AORTIC VALVE LVOT Vmax:   123.00 cm/s LVOT Vmean:  84.900 cm/s LVOT VTI:    0.240 m  AORTA Ao  Root diam: 3.50 cm MITRAL VALVE               TRICUSPID VALVE MV Area (PHT): 3.42 cm    TR Peak grad:   9.6 mmHg MV Decel Time: 222 msec    TR Vmax:        155.00 cm/s MV E velocity: 56.10 cm/s MV A velocity: 70.30 cm/s  SHUNTS MV E/A ratio:  0.80        Systemic VTI:  0.24 m                            Systemic Diam: 2.20 cm Thurmon Fair MD Electronically signed by Thurmon Fair MD Signature Date/Time: 07/11/2020/1:20:23 PM    Final     Cardiac Studies   Echocardiogram 07/11/2020: Impressions: 1. Left ventricular ejection fraction, by estimation, is 60 to 65%. The  left ventricle has normal function. The left ventricle has no regional  wall motion abnormalities. Left ventricular  diastolic parameters were  normal.  2. Right ventricular systolic function is normal. The right ventricular  size is normal. There is normal pulmonary artery systolic pressure.  3. Left atrial size was mildly dilated.  4. The mitral valve is normal in structure. No evidence of mitral valve  regurgitation. No evidence of mitral stenosis.  5. The aortic valve is normal in structure. Aortic valve regurgitation is  not visualized. No aortic stenosis is present.  6. The inferior vena cava is normal in size with greater than 50%  respiratory variability, suggesting right atrial pressure of 3 mmHg. _______________  Left Heart Catheterization 07/11/2020:  A stent was successfully placed.    Recent occlusion of nondominant right coronary.  At the time of angiography the patient was asymptomatic and having no chest pain.  This lesion was felt to represent an OAT (occluded artery trial) substrate and was not treated.  Widely patent left main, relatively short.  Large, transapical LAD with an acute angle origin from the left main and further tortuosity and angulation in the proximal and mid vessel.  After an acute bend there is a segmental region of 85% stenosis.  The LAD then wraps around the left ventricular apex.  The circumflex is dominant and contains moderate luminal irregularities diffusely.  The PDA arises from the distal circumflex.  No collaterals are seen to fill the right coronary.  Overlapping 30 x 3.0 and 8 mm x 3.0 Onyx stents postdilated to 3.5 mm reduced the 85% stenosis to less than 20% with TIMI grade III flow.  Normal LV function with EF 65%.  LVEDP normal at 16.  Recommendations:  Dual antiplatelet therapy for 1 year due to stent length and overlap.  Aspirin and Brilinta is preferred for at least the initial 3 months.  Downgrading to aspirin and clopidogrel could be a consideration thereafter.  Aggressive risk factor modification including checking hemoglobin A1c,  aggressive blood pressure control, and LDL lowering to less than 70.  Eligible for discharge in a.m. if no recurrent symptoms.   Patient Profile     49 y.o. male with a history of cardiac enlargement, hypertension, obstructive sleep apnea, PTSD who was admitted with NSTEMI on 07/10/2020.   Assessment & Plan    NSTEMI - High-sensitivity troponin peaked at 5,323.  - Echo showed LVEF of 60-65% with normal wall motion and diastolic parameters. - LHC yesterday showed recent occlusion of non-dominant RCA felt to represent occluded artery trial substrates (not treated), 85%  stenosis of a tortuous LAD followed by a 0% stenosis. LAD treated with overlapping DES x2.  - No recurrent chest pain. - Continue DAPT (Aspirin and Brilinta), beta-blocker, and high-intensity statin.  Hypertension - BP still elevated. - Can increase Coreg to 12.5mg  twice daily. - May also consider starting Amlodipine  daily.  Hyperlipidemia - Lipid panel this admission: Total Cholesterol 237, Triglycerides 64, HDL 41, LDL 183.  - LDL goal <70 given CAD. - Started on Lipitor  daily.  - AST elevated at 80 but otherwise LFTs normal. Will need to monitor this closely. - Will need repeat lipid panel and LFTs in 6 weeks.  Pre-Diabetes - Hemoglobin A1c 5.7. - Recommend lifestyle modifications.  Hypokalemia - Potassium 3.3 yesterday. Repleted and 3.9 today.  Disposition: Suspect patient can be discharged today after Cardiac Rehab has seen. MD to see with additional recommendations.  For questions or updates, please contact [facility] Please consult [email or web address] for contact info under        Signed, Corrin Parker, PA-C  07/12/2020, 7:27 AM

## 2020-07-17 ENCOUNTER — Telehealth (HOSPITAL_COMMUNITY): Payer: Self-pay

## 2020-07-17 NOTE — Telephone Encounter (Signed)
Pt has coverage thru [place].

## 2020-07-17 NOTE — Telephone Encounter (Signed)
Attempted to call patient in regards to Cardiac Rehab - LM on VM 

## 2020-07-28 ENCOUNTER — Other Ambulatory Visit: Payer: Self-pay

## 2020-07-28 ENCOUNTER — Encounter: Payer: Self-pay | Admitting: Physician Assistant

## 2020-07-28 ENCOUNTER — Ambulatory Visit (INDEPENDENT_AMBULATORY_CARE_PROVIDER_SITE_OTHER): Payer: No Typology Code available for payment source | Admitting: Physician Assistant

## 2020-07-28 VITALS — BP 108/72 | HR 50 | Ht 70.0 in | Wt 274.0 lb

## 2020-07-28 DIAGNOSIS — E785 Hyperlipidemia, unspecified: Secondary | ICD-10-CM | POA: Diagnosis not present

## 2020-07-28 DIAGNOSIS — I10 Essential (primary) hypertension: Secondary | ICD-10-CM | POA: Diagnosis not present

## 2020-07-28 DIAGNOSIS — Z79899 Other long term (current) drug therapy: Secondary | ICD-10-CM

## 2020-07-28 DIAGNOSIS — I2510 Atherosclerotic heart disease of native coronary artery without angina pectoris: Secondary | ICD-10-CM | POA: Diagnosis not present

## 2020-07-28 MED ORDER — CARVEDILOL 6.25 MG PO TABS
6.2500 mg | ORAL_TABLET | Freq: Two times a day (BID) | ORAL | 3 refills | Status: DC
Start: 2020-07-28 — End: 2020-08-03

## 2020-07-28 NOTE — Progress Notes (Signed)
Cardiology Office Note:    Date:  07/30/2020   ID:  Colin Stewart, DOB [birth year], MRN [id number]  PC[facility]  [facility] Cardiologist:  Chrystie NoseKenneth C Hilty, MD  [facility] Electrophysiologist:  None   Referring MD: [facility]   Chief Complaint  Patient presents with  . Follow-up    seen for Dr. Rennis GoldenHilty    History of Present Illness:    Colin HomerFrankie Dileo is a 49 y.o. male with a hx of PTSD, obstructive sleep apnea who recently went to the hospital on 07/10/2020 with chest pain and found to have elevated troponin consistent with NSTEMI.  Serial troponin went up to 5323.  Hemoglobin A1c was borderline elevated at 5.7.  Fasting lipid panel showed total cholesterol 237 and LDL of 183.  Echocardiogram performed on 07/11/2020 showed EF 60 to 65%, normal RVEF, no significant valve issue.  Cardiac catheterization performed on 07/11/2020 showed recently occluded nondominant RCA, given lack of chest pain, this was not repaired, 85% LAD lesion treated with 2 overlapping drug-eluting stents, moderate luminal irregularities in the left circumflex, EF 65%.  Postprocedure, he was placed on aspirin and Brilinta.  He was also placed on high intensity statin.  Plan is to repeat fasting lipid panel and LFT in 6 weeks.   Patient presents today for cardiology follow-up along with his wife.  He denies any further chest pain or shortness of breath.  We discussed the recent cardiac catheterization and echocardiogram.  His heart rate is borderline low and so is his blood pressure.  I will reduce carvedilol to 6.25 mg twice a day.  Otherwise he will need a fasting lipid panel and LFT in 4 weeks.  He can follow-up with Dr. Rennis GoldenHilty in 2 to 3 months.    Past Medical History:  Diagnosis Date  . Anxiety   . Enlarged heart   . PTSD (post-traumatic stress disorder)   . Rotator cuff injury   . Sleep apnea     Past Surgical History:  Procedure Laterality Date  . LEFT HEART CATH AND CORONARY  ANGIOGRAPHY N/A 07/11/2020   Procedure: LEFT HEART CATH AND CORONARY ANGIOGRAPHY;  Surgeon: Lyn RecordsSmith, Henry W, MD;  Location: [facility] INVASIVE CV LAB;  Service: Cardiovascular;  Laterality: N/A;    Current Medications: No outpatient medications have been marked as taking for the 07/28/20 encounter (Office Visit) with Azalee CourseMeng, Maximiano Lott, PA.     Allergies:   Patient has no known allergies.   Social History   Socioeconomic History  . Marital status: Married    Spouse name: Not on file  . Number of children: Not on file  . Years of education: Not on file  . Highest education level: Not on file  Occupational History  . Not on file  Tobacco Use  . Smoking status: Current Some Day Smoker    Types: Cigars  . Smokeless tobacco: Never Used  Vaping Use  . Vaping Use: Never used  Substance and Sexual Activity  . Alcohol use: Yes    Alcohol/week: 6.0 standard drinks    Types: 6 Cans of beer per week  . Drug use: Yes    Frequency: 1.0 times per week    Types: Marijuana  . Sexual activity: Not on file  Other Topics Concern  . Not on file  Social History Narrative  . Not on file   Social Determinants of Health   Financial Resource Strain:   . Difficulty of Paying Living Expenses: Not on file  Food Insecurity:   . Worried About Programme researcher, broadcasting/film/video in the Last Year: Not on file  . Ran Out of Food in the Last Year: Not on file  Transportation Needs:   . Lack of Transportation (Medical): Not on file  . Lack of Transportation (Non-Medical): Not on file  Physical Activity:   . Days of Exercise per Week: Not on file  . Minutes of Exercise per Session: Not on file  Stress:   . Feeling of Stress : Not on file  Social Connections:   . Frequency of Communication with Friends and Family: Not on file  . Frequency of Social Gatherings with Friends and Family: Not on file  . Attends Religious Services: Not on file  . Active Member of Clubs or Organizations: Not on file  . Attends Banker  Meetings: Not on file  . Marital Status: Not on file     Family History: The patient's family history includes CAD in his mother; Hypertension in his father.  ROS:   Please see the history of present illness.     All other systems reviewed and are negative.  EKGs/Labs/Other Studies Reviewed:    The following studies were reviewed today:  Echo 07/11/2020 1. Left ventricular ejection fraction, by estimation, is 60 to 65%. The  left ventricle has normal function. The left ventricle has no regional  wall motion abnormalities. Left ventricular diastolic parameters were  normal.  2. Right ventricular systolic function is normal. The right ventricular  size is normal. There is normal pulmonary artery systolic pressure.  3. Left atrial size was mildly dilated.  4. The mitral valve is normal in structure. No evidence of mitral valve  regurgitation. No evidence of mitral stenosis.  5. The aortic valve is normal in structure. Aortic valve regurgitation is  not visualized. No aortic stenosis is present.  6. The inferior vena cava is normal in size with greater than 50%  respiratory variability, suggesting right atrial pressure of 3 mmHg.     Cath 07/11/2020  A stent was successfully placed.    Recent occlusion of nondominant right coronary.  At the time of angiography the patient was asymptomatic and having no chest pain.  This lesion was felt to represent an OAT (occluded artery trial) substrate and was not treated.  Widely patent left main, relatively short.  Large, transapical LAD with an acute angle origin from the left main and further tortuosity and angulation in the proximal and mid vessel.  After an acute bend there is a segmental region of 85% stenosis.  The LAD then wraps around the left ventricular apex.  The circumflex is dominant and contains moderate luminal irregularities diffusely.  The PDA arises from the distal circumflex.  No collaterals are seen to fill the right  coronary.  Overlapping 30 x 3.0 and 8 mm x 3.0 Onyx stents postdilated to 3.5 mm reduced the 85% stenosis to less than 20% with TIMI grade III flow.  Normal LV function with EF 65%.  LVEDP normal at 16.  RECOMMENDATIONS:   Dual antiplatelet therapy for 1 year due to stent length and overlap.  Aspirin and Brilinta is preferred for at least the initial 3 months.  Downgrading to aspirin and clopidogrel could be a consideration thereafter.  Aggressive risk factor modification including checking hemoglobin A1c, aggressive blood pressure control, and LDL lowering to less than 70.  Eligible for discharge in a.m. if no recurrent symptoms.   EKG:  EKG is ordered today.  The ekg ordered today demonstrates sinus bradycardia, heart rate 50 bpm, no significant ST-T wave changes.  Recent Labs: 07/11/2020: ALT 29 07/12/2020: BUN 8; Creatinine, Ser 1.00; Hemoglobin 14.7; Platelets 162; Potassium 3.9; Sodium 139  Recent Lipid Panel    Component Value Date/Time   CHOL 237 (H) 07/11/2020 0204   TRIG 64 07/11/2020 0204   HDL 41 07/11/2020 0204   CHOLHDL 5.8 07/11/2020 0204   VLDL 13 07/11/2020 0204   LDLCALC 183 (H) 07/11/2020 0204    Physical Exam:    VS:  BP 108/72   Pulse (!) 50   Ht 5\' 10"  (1.778 m)   Wt 274 lb (124.3 kg)   SpO2 95%   BMI 39.31 kg/m     Wt Readings from Last 3 Encounters:  07/28/20 274 lb (124.3 kg)  07/11/20 267 lb 12.8 oz (121.5 kg)     GEN:  Well nourished, well developed in no acute distress HEENT: Normal NECK: No JVD; No carotid bruits LYMPHATICS: No lymphadenopathy CARDIAC: RRR, no murmurs, rubs, gallops RESPIRATORY:  Clear to auscultation without rales, wheezing or rhonchi  ABDOMEN: Soft, non-tender, non-distended MUSCULOSKELETAL:  No edema; No deformity  SKIN: Warm and dry NEUROLOGIC:  Alert and oriented x 3 PSYCHIATRIC:  Normal affect   ASSESSMENT:    1. Coronary artery disease involving native coronary artery of native heart without angina  pectoris   2. Hypertension, unspecified type   3. Hyperlipidemia LDL goal <70   4. Medication management    PLAN:    In order of problems listed above:  1. CAD: Denies any chest pain.  Continue aspirin and Brilinta  2. Hypertension: Blood pressure borderline low, heart rate only 50 bpm.  Will reduce carvedilol to 6.25 mg twice a day  3. Hyperlipidemia: Continue on Lipitor.  Fasting lipid panel and LFT in 4 to 6 weeks.   Medication Adjustments/Labs and Tests Ordered: Current medicines are reviewed at length with the patient today.  Concerns regarding medicines are outlined above.  Orders Placed This Encounter  Procedures  . Lipid Profile  . Hepatic function panel  . EKG 12-Lead   Meds ordered this encounter  Medications  . carvedilol (COREG) 6.25 MG tablet    Sig: Take 1 tablet (6.25 mg total) by mouth 2 (two) times daily.    Dispense:  180 tablet    Refill:  3    Patient Instructions  Medication Instructions:  Your physician has recommended you make the following change in your medication: Decrease your Coreg to 6.25 mg twice a day.   *If you need a refill on your cardiac medications before your next appointment, please call your pharmacy*   Lab Work: Lipid and hepatic function tests in 4 weeks... Fasting   If you have labs (blood work) drawn today and your tests are completely normal, you will receive your results only by: 09/10/20 MyChart Message (if you have MyChart) OR . A paper copy in the mail If you have any lab test that is abnormal or we need to change your treatment, we will call you to review the results.   Testing/Procedures: none   Follow-Up: At [facility], you and your health needs are our priority.  As part of our continuing mission to provide you with exceptional heart care, we have created designated Provider Care Teams.  These Care Teams include your primary Cardiologist (physician) and Advanced Practice Providers (APPs -  Physician Assistants and  Nurse Practitioners) who all work together to provide you with  the care you need, when you need it.  We recommend signing up for the patient portal called "MyChart".  Sign up information is provided on this After Visit Summary.  MyChart is used to connect with patients for Virtual Visits (Telemedicine).  Patients are able to view lab/test results, encounter notes, upcoming appointments, etc.  Non-urgent messages can be sent to your provider as well.   To learn more about what you can do with MyChart, go to [email or web address].    Your next appointment:   3 month(s)  The format for your next appointment:   In Person  Provider:   Kirtland Bouchard [place] Hilty, MD   Other Instructions      Signed, Azalee Course, PA  07/30/2020 10:19 PM    [facility]

## 2020-07-28 NOTE — Patient Instructions (Addendum)
Medication Instructions:  Your physician has recommended you make the following change in your medication: Decrease your Coreg to 6.25 mg twice a day.   *If you need a refill on your cardiac medications before your next appointment, please call your pharmacy*   Lab Work: Lipid and hepatic function tests in 4 weeks... Fasting   If you have labs (blood work) drawn today and your tests are completely normal, you will receive your results only by: Marland Kitchen MyChart Message (if you have MyChart) OR . A paper copy in the mail If you have any lab test that is abnormal or we need to change your treatment, we will call you to review the results.   Testing/Procedures: none   Follow-Up: At [facility], you and your health needs are our priority.  As part of our continuing mission to provide you with exceptional heart care, we have created designated Provider Care Teams.  These Care Teams include your primary Cardiologist (physician) and Advanced Practice Providers (APPs -  Physician Assistants and Nurse Practitioners) who all work together to provide you with the care you need, when you need it.  We recommend signing up for the patient portal called "MyChart".  Sign up information is provided on this After Visit Summary.  MyChart is used to connect with patients for Virtual Visits (Telemedicine).  Patients are able to view lab/test results, encounter notes, upcoming appointments, etc.  Non-urgent messages can be sent to your provider as well.   To learn more about what you can do with MyChart, go to [email or web address].    Your next appointment:   3 month(s)  The format for your next appointment:   In Person  Provider:   K. Italy Hilty, MD   Other Instructions

## 2020-07-29 ENCOUNTER — Telehealth: Payer: Self-pay | Admitting: Nurse Practitioner

## 2020-07-29 NOTE — Telephone Encounter (Signed)
   Pt s/p MI/PCI and also has peridontal dzs.  He was seen in clinic yesterday and forgot to mention that he has been having gingival bleeding since being placed on aspirin and Brilinta.  He can generally control this by swishing cold water.  I advised that he should not stop aspirin and Brilinta and should seek dental care.  If he has recurrent bleeding that becomes difficult to control, he may need to be seen in the emergency room.  Caller verbalized understanding and was grateful for the call back.  Nicolasa Ducking, NP 07/29/2020, 11:12 AM

## 2020-07-30 ENCOUNTER — Encounter: Payer: Self-pay | Admitting: Physician Assistant

## 2020-07-31 ENCOUNTER — Telehealth: Payer: Self-pay | Admitting: Internal Medicine

## 2020-07-31 NOTE — Telephone Encounter (Signed)
Patient NSTEMI and stent placement on 07/10/2020.For now is NOT recommended to stop Brilinta d/t to recent stent placement.   Mouth rinse with salt water three times daily will help decrease minor inflammation on your gums. Will recommend to contact dentist to assess any mayor inflammation not resolved after 1-2 days using salt water.

## 2020-07-31 NOTE — Telephone Encounter (Signed)
Patient's wife called and stated that the patient has been bleeding from gumline since heart attack and stent put in. Bleeding was severe on Saturday and they would like to know what can they do to make it stop since he is on blood thinners. Please call to advise

## 2020-07-31 NOTE — Telephone Encounter (Signed)
Rodriguez-Guzman, Raquel, RPH-CPP 22 minutes ago (8:50 AM)     Patient NSTEMI and stent placement on 07/10/2020.For now is NOT recommended to stop Brilinta d/t to recent stent placement.   Mouth rinse with salt water three times daily will help decrease minor inflammation on your gums. Will recommend to contact dentist to assess any mayor inflammation not resolved after 1-2 days using salt water.     Patients wife made aware of the above. She states that the patient has periodontal disease. I suggested that they go ahead and reach out to his dentist immediately for an appointment.

## 2020-07-31 NOTE — Telephone Encounter (Signed)
Thanks, I agree - cannot stop Brilinta at this point. Reach out to dentist.  Dr Rexene Edison

## 2020-08-03 ENCOUNTER — Telehealth: Payer: Self-pay | Admitting: Internal Medicine

## 2020-08-03 MED ORDER — CARVEDILOL 6.25 MG PO TABS: 6.2500 mg | ORAL_TABLET | Freq: Two times a day (BID) | ORAL | 3 refills | Status: DC

## 2020-08-03 NOTE — Telephone Encounter (Signed)
Patient's wife would like to clarify all the patient's medications. She states the [place] has been sending prescriptions, but she is not sure what he is supposed to be on.

## 2020-08-03 NOTE — Telephone Encounter (Signed)
Spoke with patient's wife. She states the [place] sent patient Rx for carvedilol 25mg  (take one-half tablet BID). Sent Rx to for carvedilol 6.25mg  BID as decreased by [place] PA on 07/28/20. Advised that she contact office if they need a printed Rx or this was not received by PA for any reason. She has enough 12.5mg  tablets to cut in half for patient to take

## 2020-08-04 NOTE — Telephone Encounter (Signed)
Wife calls to report dental office can't get pt in till 14th. Reports bleeding amount is "like getting hit in the mouth and it bleeding, dripping down".  She is worried that he is bleeding too much.  It is occurring about 2-3 times/day. Advised that not substantial amt to be concerned at this time, and that dental evaluation is needed to determine what best treatment plan should be, especially since pt has hx of periodontal disease. Advised to contact office for sooner appt and/or establish with another practice if they do not want to wait (she reports that he hasn't been to dentist in 2 years and previous dentist retired) She will call back if she has further concerns/needs r/t this matter.

## 2020-08-04 NOTE — Telephone Encounter (Signed)
Patient's wife calling back. °

## 2020-08-15 ENCOUNTER — Telehealth: Payer: Self-pay | Admitting: Physician Assistant

## 2020-08-15 NOTE — Telephone Encounter (Signed)
Colin Stewart, from the [place] called stating they need the OV from 8/27 visit with [facility] faxed to them at [phone] Attn: medical records.

## 2020-08-15 NOTE — Telephone Encounter (Addendum)
Epic faxed [facility]/28/[facility] to [facility] to the Attention of Medical Records.

## 2020-08-22 ENCOUNTER — Other Ambulatory Visit: Payer: Self-pay

## 2020-08-22 DIAGNOSIS — E785 Hyperlipidemia, unspecified: Secondary | ICD-10-CM

## 2020-08-22 DIAGNOSIS — I10 Essential (primary) hypertension: Secondary | ICD-10-CM

## 2020-08-22 DIAGNOSIS — Z79899 Other long term (current) drug therapy: Secondary | ICD-10-CM

## 2020-08-22 DIAGNOSIS — I2510 Atherosclerotic heart disease of native coronary artery without angina pectoris: Secondary | ICD-10-CM

## 2020-08-22 LAB — LIPID PANEL
Chol/HDL Ratio: 3.7 ratio (ref 0.0–5.0)
Cholesterol, Total: 142 mg/dL (ref 100–199)
HDL: 38 mg/dL — ABNORMAL LOW (ref >39)
LDL Chol Calc (NIH): 90 mg/dL (ref 0–99)
Triglycerides: 71 mg/dL (ref 0–149)
VLDL Cholesterol Cal: 14 mg/dL (ref 5–40)

## 2020-08-22 LAB — HEPATIC FUNCTION PANEL
ALT: 25 IU/L (ref 0–44)
AST: 20 IU/L (ref 0–40)
Albumin: 4.4 g/dL (ref 3.8–4.9)
Alkaline Phosphatase: 70 IU/L (ref 44–121)
Bilirubin Total: 0.4 mg/dL (ref 0.0–1.2)
Bilirubin, Direct: 0.11 mg/dL (ref 0.00–0.40)
Total Protein: 6.7 g/dL (ref 6.0–8.5)

## 2020-08-29 ENCOUNTER — Telehealth: Payer: Self-pay | Admitting: Internal Medicine

## 2020-08-29 DIAGNOSIS — E785 Hyperlipidemia, unspecified: Secondary | ICD-10-CM

## 2020-08-29 NOTE — Telephone Encounter (Signed)
New Message:    Wife is calling. She says she think the pt have missed his morning dose of all his medicine. He is a [employer] and she usually reminds him to take his medicine, but the phone is down and she is unable to get him. Her question is, does he take the medicine whenever she gets him and does he still his evening the medicine the same?

## 2020-08-31 MED ORDER — EZETIMIBE 10 MG PO TABS
10.0000 mg | ORAL_TABLET | Freq: Every day | ORAL | 3 refills | Status: DC
Start: 2020-08-31 — End: 2021-07-27

## 2020-08-31 NOTE — Telephone Encounter (Signed)
Spoke with pt wife, medication questions answered. Lab work also discussed and results given. New script sent to the pharmacy and Lab orders mailed to the pt.

## 2020-09-12 NOTE — Telephone Encounter (Signed)
Patient's wife called wanting to verify his new medication was Zetia 10mg .

## 2020-10-06 ENCOUNTER — Telehealth: Payer: Self-pay | Admitting: Internal Medicine

## 2020-10-06 NOTE — Telephone Encounter (Signed)
Ok to use- should use for shortest time needed. Avoid any product that has pseudoephedrine in it.

## 2020-10-06 NOTE — Telephone Encounter (Signed)
Pt c/o medication issue:  1. Name of Medication: Theraflu  2. How are you currently taking this medication (dosage and times per day)? n/a  3. Are you having a reaction (difficulty breathing--STAT)? no  4. What is your medication issue? Patient's wife wants to know if it is safe for the patient to take this with all of the cardiac medications that he is on. Please advise.

## 2020-10-06 NOTE — Telephone Encounter (Signed)
Spoke with patient's spouse and relayed pharmacists recommendations.

## 2020-10-23 LAB — HEPATIC FUNCTION PANEL
ALT: 18 IU/L (ref 0–44)
AST: 15 IU/L (ref 0–40)
Albumin: 4.5 g/dL (ref 3.8–4.9)
Alkaline Phosphatase: 68 IU/L (ref 44–121)
Bilirubin Total: 0.3 mg/dL (ref 0.0–1.2)
Bilirubin, Direct: 0.13 mg/dL (ref 0.00–0.40)
Total Protein: 6.3 g/dL (ref 6.0–8.5)

## 2020-10-23 LAB — LIPID PANEL
Chol/HDL Ratio: 2.6 ratio (ref 0.0–5.0)
Cholesterol, Total: 113 mg/dL (ref 100–199)
HDL: 44 mg/dL (ref >39)
LDL Chol Calc (NIH): 58 mg/dL (ref 0–99)
Triglycerides: 42 mg/dL (ref 0–149)
VLDL Cholesterol Cal: 11 mg/dL (ref 5–40)

## 2020-10-30 ENCOUNTER — Ambulatory Visit (INDEPENDENT_AMBULATORY_CARE_PROVIDER_SITE_OTHER): Payer: No Typology Code available for payment source | Admitting: Internal Medicine

## 2020-10-30 ENCOUNTER — Encounter: Payer: Self-pay | Admitting: Internal Medicine

## 2020-10-30 ENCOUNTER — Other Ambulatory Visit: Payer: Self-pay

## 2020-10-30 VITALS — BP 116/76 | HR 55 | Ht 70.0 in | Wt 264.8 lb

## 2020-10-30 DIAGNOSIS — E785 Hyperlipidemia, unspecified: Secondary | ICD-10-CM

## 2020-10-30 DIAGNOSIS — I2510 Atherosclerotic heart disease of native coronary artery without angina pectoris: Secondary | ICD-10-CM | POA: Diagnosis not present

## 2020-10-30 DIAGNOSIS — I10 Essential (primary) hypertension: Secondary | ICD-10-CM | POA: Diagnosis not present

## 2020-10-30 DIAGNOSIS — G4733 Obstructive sleep apnea (adult) (pediatric): Secondary | ICD-10-CM

## 2020-10-30 DIAGNOSIS — Z9989 Dependence on other enabling machines and devices: Secondary | ICD-10-CM

## 2020-10-30 DIAGNOSIS — E668 Other obesity: Secondary | ICD-10-CM

## 2020-10-30 NOTE — Patient Instructions (Signed)
Medication Instructions:  Your physician recommends that you continue on your current medications as directed. Please refer to the Current Medication list given to you today.  *If you need a refill on your cardiac medications before your next appointment, please call your pharmacy*   Follow-Up: At [facility], you and your health needs are our priority.  As part of our continuing mission to provide you with exceptional heart care, we have created designated Provider Care Teams.  These Care Teams include your primary Cardiologist (physician) and Advanced Practice Providers (APPs -  Physician Assistants and Nurse Practitioners) who all work together to provide you with the care you need, when you need it.  We recommend signing up for the patient portal called "MyChart".  Sign up information is provided on this After Visit Summary.  MyChart is used to connect with patients for Virtual Visits (Telemedicine).  Patients are able to view lab/test results, encounter notes, upcoming appointments, etc.  Non-urgent messages can be sent to your provider as well.   To learn more about what you can do with MyChart, go to [email or web address].    Your next appointment:   August 2022  The format for your next appointment:   In Person  Provider:   Dr. Zoila Shutter   Other Instructions

## 2020-10-30 NOTE — Progress Notes (Signed)
OFFICE NOTE  Chief Complaint:  Follow-up coronary disease  Primary Care Physician: [facility]  HPI:  Colin Stewart is a 49 y.o. male with a past medial history significant for PTSD, obstructive sleep apnea who recently went to the hospital on 07/10/2020 with chest pain and found to have elevated troponin consistent with NSTEMI.  Serial troponin went up to 5323.  Hemoglobin A1c was borderline elevated at 5.7.  Fasting lipid panel showed total cholesterol 237 and LDL of 183.  Echocardiogram performed on 07/11/2020 showed EF 60 to 65%, normal RVEF, no significant valve issue.  Cardiac catheterization performed on 07/11/2020 showed recently occluded nondominant RCA, given lack of chest pain, this was not repaired, 85% LAD lesion treated with 2 overlapping drug-eluting stents, moderate luminal irregularities in the left circumflex, EF 65%.  Postprocedure, he was placed on aspirin and Brilinta.  He was also placed on high intensity statin.  Plan is to repeat fasting lipid panel and LFT in 6 weeks.   10/30/2020  Colin Stewart returns today for follow-up.  He continues to do well.  He denies any chest pain or worsening shortness of breath did have repeat labs in November which showed marked improvement in his lipids.  His total cholesterol is now 113, HDL 44, LDL 58 and triglycerides 42.  A1c over the summer was 5.7 hemoglobin was 14.7 in August.  Apparently he had recent labs at the [place] which showed hemoglobin of 13.3.  This is low end of normal however his provider apparently told him she was concerned.  His MCV was normal at 90 and his iron studies were low normal but still in the normal range.  He is supposed to do a Hemoccult.  He denies any overt bleeding.  He is lost weight and is exercising more.  He has no new complaints  PMHx:  Past Medical History:  Diagnosis Date  . Anxiety   . Enlarged heart   . PTSD (post-traumatic stress disorder)   . Rotator cuff injury   . Sleep apnea      Past Surgical History:  Procedure Laterality Date  . LEFT HEART CATH AND CORONARY ANGIOGRAPHY N/A 07/11/2020   Procedure: LEFT HEART CATH AND CORONARY ANGIOGRAPHY;  Surgeon: Lyn Records, MD;  [facility];  Service: Cardiovascular;  Laterality: N/A;    FAMHx:  Family History  Problem Relation Age of Onset  . CAD Mother   . Hypertension Father     SOCHx:   reports that he has been smoking cigars. He has never used smokeless tobacco. He reports current alcohol use of about 6.0 standard drinks of alcohol per week. He reports current drug use. Frequency: 1.00 time per week. Drug: Marijuana.  ALLERGIES:  No Known Allergies  ROS: Pertinent items noted in HPI and remainder of comprehensive ROS otherwise negative.  HOME MEDS: Current Outpatient Medications on File Prior to Visit  Medication Sig Dispense Refill  . aspirin 81 MG chewable tablet Chew 1 tablet (81 mg total) by mouth daily.    Marland Kitchen atorvastatin (LIPITOR) 80 MG tablet Take 1 tablet (80 mg total) by mouth at bedtime. 30 tablet 2  . carvedilol (COREG) 6.25 MG tablet Take 1 tablet (6.25 mg total) by mouth 2 (two) times daily. 180 tablet 3  . ezetimibe (ZETIA) 10 MG tablet Take 1 tablet (10 mg total) by mouth daily. 90 tablet 3  . nitroGLYCERIN (NITROSTAT) 0.4 MG SL tablet Place 1 tablet (0.4 mg total) under the tongue every  5 (five) minutes as needed for chest pain. 25 tablet 2  . ticagrelor (BRILINTA) 90 MG TABS tablet Take 1 tablet (90 mg total) by mouth 2 (two) times daily. 60 tablet 11   No current facility-administered medications on file prior to visit.    LABS/IMAGING: No results found for this or any previous visit (from the past 48 hour(s)). No results found.  LIPID PANEL:    Component Value Date/Time   CHOL 113 10/23/2020 0813   TRIG 42 10/23/2020 0813   HDL 44 10/23/2020 0813   CHOLHDL 2.6 10/23/2020 0813   CHOLHDL 5.8 07/11/2020 0204   VLDL 13 07/11/2020 0204   LDLCALC 58 10/23/2020  0813     WEIGHTS: Wt Readings from Last 3 Encounters:  10/30/20 264 lb 12.8 oz (120.1 kg)  07/28/20 274 lb (124.3 kg)  07/11/20 267 lb 12.8 oz (121.5 kg)    VITALS: BP 116/76   Pulse (!) 55   Ht 5\' 10"  (1.778 m)   Wt 264 lb 12.8 oz (120.1 kg)   SpO2 98%   BMI 37.99 kg/m   EXAM: General appearance: alert, no distress and moderately obese Neck: no carotid bruit, no JVD and thyroid not enlarged, symmetric, no tenderness/mass/nodules Lungs: clear to auscultation bilaterally Heart: regular rate and rhythm, S1, S2 normal, no murmur, click, rub or gallop Abdomen: soft, non-tender; bowel sounds normal; no masses,  no organomegaly and obese Extremities: extremities normal, atraumatic, no cyanosis or edema Pulses: 2+ and symmetric Skin: Skin color, texture, turgor normal. No rashes or lesions Neurologic: Grossly normal Psych:Pleasant  EKG: Sinus bradycardia 55- personally reviewed  ASSESSMENT: 1. NSTEMI, status post PCI x2 to the LAD, occluded nondominant RCA-07/2020 2. Dyslipidemia 3. Hypertension 4. Moderate obesity 5. OSA on CPAP  PLAN: 1.   Colin Stewart is doing well without any recurrent chest pain or worsening shortness of breath.  He will remain on dual antiplatelet therapy until August 2022.  His cholesterol is at goal with LDL less than 70.  Blood pressure is well controlled.  He is lost 20 pounds recently and continues to work on weight loss.  He is compliant with CPAP.  His PCP was concerned about a low normal hemoglobin.  That will continue to be followed there although his iron stores and MCV were normal.  Follow-up with me next August or sooner as necessary.  September, MD, [facility], FACP  [place]  [facility]  Medical Director of the [facility] Lipid Disorders &  Cardiovascular Risk Reduction Clinic Diplomate of the American Board of Clinical Lipidology Attending Cardiologist  Direct Dial: [phone]  Fax: [phone]  Website:   [email or web address] 10/30/2020, 9:38 AM

## 2021-04-02 ENCOUNTER — Telehealth: Payer: Self-pay | Admitting: Internal Medicine

## 2021-04-02 NOTE — Telephone Encounter (Signed)
Spoke with stephanie, she thinks the dog has transferred poison to him. Okay for him to take claritin and benadryl for the poison.

## 2021-04-02 NOTE — Telephone Encounter (Signed)
[name] is calling stating Colin Stewart has a rash with whelps on his arm that are blistering with one popping. She states the rash occurred today and they are unsure what it is. Reports it does not itch has given him a claritin, but it has not seemed to help. Wanting to get it under control before he leaves for [place]. Wanting to know what to give him.

## 2021-07-26 NOTE — Progress Notes (Signed)
Cardiology Office Note   Date:  07/27/2021   ID:  Khyrie, Masi Apr 29, 1971, MRN [id number]  PCP:  [facility]  Cardiologist: Dr. Rennis Golden CC: One year follow up   History of Present Illness: Colin Stewart is a 50 y.o. male who presents for ongoing assessment and management of coronary artery disease, with cardiac catheterization on 07/11/2020 revealing recently occluded nondominant RCA, 85% LAD lesion treated with 2 overlapping DES with moderate luminal irregularities in the left circumflex.  Due to lack of chest discomfort the patient's RCA did not have intervention.  Other history includes hyperlipidemia, PTSD, OSA on CPAP, moderate obesity, and hypertension..  Was last seen in the office by Dr. Rennis Golden on 10/30/2020 at which time the patient was without any cardiac symptoms.  Repeat labs revealed excellent control of cholesterol.  LDL was 58 with a total cholesterol of 113 on high intensity statin.  He is here for 1 year follow-up.  He is without cardiac complaints. He walks 1.5 miles a day.  He is medically compliant. Struggles with CPAP but wears it most of the time.  He would like to stop taking DOAC.  He states he does not like taking a lot of pills.  He denies significant bruising or bleeding. Has some dental disease that needs addressing. Wants to proceed with this.   Past Medical History:  Diagnosis Date   Anxiety    Enlarged heart    PTSD (post-traumatic stress disorder)    Rotator cuff injury    Sleep apnea     Past Surgical History:  Procedure Laterality Date   LEFT HEART CATH AND CORONARY ANGIOGRAPHY N/A 07/11/2020   Procedure: LEFT HEART CATH AND CORONARY ANGIOGRAPHY;  Surgeon: Lyn Records, MD;  Location: [facility];  Service: Cardiovascular;  Laterality: N/A;     Current Outpatient Medications  Medication Sig Dispense Refill   aspirin 81 MG chewable tablet Chew 1 tablet (81 mg total) by mouth daily.     atorvastatin (LIPITOR) 80 MG tablet Take  1 tablet (80 mg total) by mouth at bedtime. 30 tablet 2   carvedilol (COREG) 6.25 MG tablet Take 1 tablet (6.25 mg total) by mouth 2 (two) times daily. 180 tablet 3   ezetimibe (ZETIA) 10 MG tablet Take 1 tablet (10 mg total) by mouth daily. 90 tablet 3   nitroGLYCERIN (NITROSTAT) 0.4 MG SL tablet Place 1 tablet (0.4 mg total) under the tongue every 5 (five) minutes as needed for chest pain. 25 tablet 2   ticagrelor (BRILINTA) 90 MG TABS tablet Take 1 tablet (90 mg total) by mouth 2 (two) times daily. 60 tablet 11   No current facility-administered medications for this visit.    Allergies:   Patient has no known allergies.    Social History:  The patient  reports that he has been smoking cigars. He has never used smokeless tobacco. He reports current alcohol use of about 6.0 standard drinks per week. He reports current drug use. Frequency: 1.00 time per week. Drug: Marijuana.   Family History:  The patient's family history includes CAD in his mother; Hypertension in his father.    ROS: All other systems are reviewed and negative. Unless otherwise mentioned in H&P    PHYSICAL EXAM: VS:  BP (!) 142/92   Pulse (!) 58   Ht 5\' 10"  (1.778 m)   Wt 273 lb 12.8 oz (124.2 kg)   BMI 39.29 kg/m  , BMI Body mass index is 39.29 kg/m. GEN:  Well nourished, well developed, in no acute distress HEENT: normal Neck: no JVD, carotid bruits, or masses Cardiac: RRR; no murmurs, rubs, or gallops,no edema  Respiratory:  Clear to auscultation bilaterally, normal work of breathing GI: soft, nontender, nondistended, + BS MS: no deformity or atrophy Skin: warm and dry, no rash Neuro:  Strength and sensation are intact Psych: euthymic mood, full affect   EKG:  EKG is ordered today. The ekg ordered today demonstrates (Personally reviewed). Sinus bradycardia, HR of 58 bpm.     Recent Labs: 10/23/2020: ALT 18    Lipid Panel    Component Value Date/Time   CHOL 113 10/23/2020 0813   TRIG 42  10/23/2020 0813   HDL 44 10/23/2020 0813   CHOLHDL 2.6 10/23/2020 0813   CHOLHDL 5.8 07/11/2020 0204   VLDL 13 07/11/2020 0204   LDLCALC 58 10/23/2020 0813      Wt Readings from Last 3 Encounters:  07/27/21 273 lb 12.8 oz (124.2 kg)  10/30/20 264 lb 12.8 oz (120.1 kg)  07/28/20 274 lb (124.3 kg)      Other studies Reviewed: Left Heart Cath 07/11/2020 A stent was successfully placed.   Recent occlusion of nondominant right coronary.  At the time of angiography the patient was asymptomatic and having no chest pain.  This lesion was felt to represent an OAT (occluded artery trial) substrate and was not treated. Widely patent left main, relatively short. Large, transapical LAD with an acute angle origin from the left main and further tortuosity and angulation in the proximal and mid vessel.  After an acute bend there is a segmental region of 85% stenosis.  The LAD then wraps around the left ventricular apex. The circumflex is dominant and contains moderate luminal irregularities diffusely.  The PDA arises from the distal circumflex.  No collaterals are seen to fill the right coronary. Overlapping 30 x 3.0 and 8 mm x 3.0 Onyx stents postdilated to 3.5 mm reduced the 85% stenosis to less than 20% with TIMI grade III flow. Normal LV function with EF 65%.  LVEDP normal at 16.   RECOMMENDATIONS:   Dual antiplatelet therapy for 1 year due to stent length and overlap.  Aspirin and Brilinta is preferred for at least the initial 3 months.  Downgrading to aspirin and clopidogrel could be a consideration thereafter. Aggressive risk factor modification including checking hemoglobin A1c, aggressive blood pressure control, and LDL lowering to less than 70. Eligible for discharge in a.m. if no recurrent symptoms.  ASSESSMENT AND PLAN:  1.  CAD: Hx of CTO of RCA (non-dominant), LAD disease s/p DES x 2.  He is adamant about stopping Brilinta.  Since is has been over a year will stop this , but will  continue ASA 81 mg daily. Will continue statin and BB.   2. Hypertension: Has "white coat syndrome" and I rechecked his BP in the clinic room. Recheck 136/78.  No changes in coreg at this time.   3. Hyperlipidemia: Will recheck labs this am. Goal of LDL < 70.    Current medicines are reviewed at length with the patient today.  I have spent 25 min's dedicated to the care of this patient on the date of this encounter to include pre-visit review of records, assessment, management and diagnostic testing,with shared decision making.  Labs/ tests ordered today include: CMET, Lipids, and CBC.   Bettey Mare. Liborio Nixon, ANP, AACC   07/27/2021 8:56 AM    [facility] [address] Office [phone] Fax [phone]  Notice: This dictation was prepared with Dragon dictation along with smaller phrase technology. Any transcriptional errors that result from this process are unintentional and may not be corrected upon review.

## 2021-07-27 ENCOUNTER — Ambulatory Visit (INDEPENDENT_AMBULATORY_CARE_PROVIDER_SITE_OTHER): Payer: No Typology Code available for payment source | Admitting: Adult Health

## 2021-07-27 ENCOUNTER — Telehealth: Payer: Self-pay | Admitting: Adult Health

## 2021-07-27 ENCOUNTER — Encounter: Payer: Self-pay | Admitting: Adult Health

## 2021-07-27 ENCOUNTER — Other Ambulatory Visit: Payer: Self-pay

## 2021-07-27 VITALS — BP 142/92 | HR 58 | Ht 70.0 in | Wt 273.8 lb

## 2021-07-27 DIAGNOSIS — I2510 Atherosclerotic heart disease of native coronary artery without angina pectoris: Secondary | ICD-10-CM | POA: Diagnosis not present

## 2021-07-27 DIAGNOSIS — Z79899 Other long term (current) drug therapy: Secondary | ICD-10-CM

## 2021-07-27 DIAGNOSIS — E7800 Pure hypercholesterolemia, unspecified: Secondary | ICD-10-CM | POA: Diagnosis not present

## 2021-07-27 DIAGNOSIS — E785 Hyperlipidemia, unspecified: Secondary | ICD-10-CM | POA: Diagnosis not present

## 2021-07-27 DIAGNOSIS — I10 Essential (primary) hypertension: Secondary | ICD-10-CM

## 2021-07-27 MED ORDER — CARVEDILOL 6.25 MG PO TABS: 6.2500 mg | ORAL_TABLET | Freq: Two times a day (BID) | ORAL | 3 refills | Status: AC

## 2021-07-27 MED ORDER — EZETIMIBE 10 MG PO TABS: 10.0000 mg | ORAL_TABLET | Freq: Every day | ORAL | 3 refills | Status: AC

## 2021-07-27 MED ORDER — ATORVASTATIN CALCIUM 80 MG PO TABS: 80.0000 mg | ORAL_TABLET | Freq: Every day | ORAL | 3 refills | Status: AC

## 2021-07-27 NOTE — Telephone Encounter (Signed)
Returned phone call to Mr. Shorts wife.  She wished me to go over the office visit with her as she was unable to attend.  She asked many questions about how we would know from a cardiology standpoint that the stent was still working and that the medication was not allowing any further blockages to occur or worsen.  I let her know that they usually are based on symptoms and the need to have the patient adhere to the medication regimen.  She stated that her husband did not have any symptoms prior to his chest pain or MI.  That he was able to drive.  She is concerned that he is on the correct medication but would not allow him to have another event.  I explained to her that there would be no 100% guarantees that he would not have another event especially if he was noncompliant with medication, that he continue to eat high cholesterol foods, did not exercise, and minimize his risk factors.  I advised her that we have him on the best medication regimen concerning risk reduction.  I explained to her also that he was adamant about being off of the Brilinta.  It was reasonable to stop the Brilinta per [facility] guidelines after 1 year.  She also requested that he have his appointment with Dr. Rennis Golden that was canceled in November.  I will try and get him scheduled for a 66-month follow-up as I am uncertain that Dr. Rennis Golden will be available in November.  She agreed to a 24-month follow-up.  I will notify my nurse to work with scheduling to see if Mr. Gutzmer can be seen in 6 months on Dr. Rennis Golden schedule.  She also ask about the results of the labs that were drawn this morning (CMP T, CBC, and lipids and LFTs).  I advised her that we would not have the lab results until the first of next week as this is a Friday and they have yet to be processed.  I advised her that she would be called with the results.  She was to be called as soon as possible concerning these results at the number listed as [phone].  I assured her that she  would be notified about the test results.

## 2021-07-27 NOTE — Patient Instructions (Addendum)
Medication Instructions:  STOP- Brilinta   *If you need a refill on your cardiac medications before your next appointment, please call your pharmacy*   Lab Work: CMP, CBC and Fasting Lipids  If you have labs (blood work) drawn today and your tests are completely normal, you will receive your results only by: MyChart Message (if you have MyChart) OR A paper copy in the mail If you have any lab test that is abnormal or we need to change your treatment, we will call you to review the results.   Testing/Procedures: None Ordered   Follow-Up: At [facility], you and your health needs are our priority.  As part of our continuing mission to provide you with exceptional heart care, we have created designated Provider Care Teams.  These Care Teams include your primary Cardiologist (physician) and Advanced Practice Providers (APPs -  Physician Assistants and Nurse Practitioners) who all work together to provide you with the care you need, when you need it.  We recommend signing up for the patient portal called "MyChart".  Sign up information is provided on this After Visit Summary.  MyChart is used to connect with patients for Virtual Visits (Telemedicine).  Patients are able to view lab/test results, encounter notes, upcoming appointments, etc.  Non-urgent messages can be sent to your provider as well.   To learn more about what you can do with MyChart, go to [email or web address].    Your next appointment:   1 year(s)  The format for your next appointment:   In Person  Provider:   You may see Chrystie Nose, MD or one of the following Advanced Practice Providers on your designated Care Team:   Azalee Course, PA-C Micah Flesher, PA-C or  Judy Pimple, [place]

## 2021-07-28 LAB — LIPID PANEL
Chol/HDL Ratio: 2.6 ratio (ref 0.0–5.0)
Cholesterol, Total: 119 mg/dL (ref 100–199)
HDL: 46 mg/dL (ref >39)
LDL Chol Calc (NIH): 60 mg/dL (ref 0–99)
Triglycerides: 57 mg/dL (ref 0–149)
VLDL Cholesterol Cal: 13 mg/dL (ref 5–40)

## 2021-07-28 LAB — COMPREHENSIVE METABOLIC PANEL
ALT: 24 IU/L (ref 0–44)
AST: 20 IU/L (ref 0–40)
Albumin/Globulin Ratio: 2.4 — ABNORMAL HIGH (ref 1.2–2.2)
Albumin: 4.5 g/dL (ref 3.8–4.9)
Alkaline Phosphatase: 59 IU/L (ref 44–121)
BUN/Creatinine Ratio: 15 (ref 9–20)
BUN: 15 mg/dL (ref 6–24)
Bilirubin Total: 0.5 mg/dL (ref 0.0–1.2)
CO2: 22 mmol/L (ref 20–29)
Calcium: 9.4 mg/dL (ref 8.7–10.2)
Chloride: 106 mmol/L (ref 96–106)
Creatinine, Ser: 0.97 mg/dL (ref 0.76–1.27)
Globulin, Total: 1.9 g/dL (ref 1.5–4.5)
Glucose: 108 mg/dL — ABNORMAL HIGH (ref 65–99)
Potassium: 4.7 mmol/L (ref 3.5–5.2)
Sodium: 144 mmol/L (ref 134–144)
Total Protein: 6.4 g/dL (ref 6.0–8.5)
eGFR: 94 mL/min/{1.73_m2} (ref >59)

## 2021-07-28 LAB — CBC
Hematocrit: 41.9 % (ref 37.5–51.0)
Hemoglobin: 14 g/dL (ref 13.0–17.7)
MCH: 30.8 pg (ref 26.6–33.0)
MCHC: 33.4 g/dL (ref 31.5–35.7)
MCV: 92 fL (ref 79–97)
Platelets: 197 10*3/uL (ref 150–450)
RBC: 4.54 x10E6/uL (ref 4.14–5.80)
RDW: 12.5 % (ref 11.6–15.4)
WBC: 5.7 10*3/uL (ref 3.4–10.8)

## 2021-07-30 ENCOUNTER — Telehealth: Payer: Self-pay | Admitting: Adult Health

## 2021-07-30 NOTE — Telephone Encounter (Signed)
Colin Stewart is calling requesting [place]'s lab results. Please advise.

## 2021-07-30 NOTE — Telephone Encounter (Signed)
Returned call to patients wife (okay per Colin Stewart) regarding patients recent lab results Advised her of the following: Jodelle Gross, NP  07/28/2021  9:43 AM EDT     I have reviewed Colin Stewart labs. His levels are normal except tiny bit  low on protein. Pleased with his cholesterol numbers. Need to keep LDL < 70. His is 60. This is great results. Should increase his protein a bit and do more activity, walking etc to build muscle. See Hilty in 6 months. Wife wants a call with results.    Thank you! KL    Patients wife states that patient got on his MyChart and saw his lab results and had a panic attack due to them. Patient's wife reports that patient's blood pressure was elevated at 159/101, and later after he calmed down 140/90. Patients wife states that patient is very concerned about his blood pressure and his lab results. Reiterated lab results to patient and patient's wife, went over some heart healthy protein options and activity goals. Advised to monitor his blood pressure and if consistently getting elevated blood pressure readings or if symptoms develop then to call back to office to let us know. Patient and patient's wife very grateful for call back and verbalized understanding of all instructions.

## 2021-10-16 IMAGING — CR DG CHEST 2V
2 series · 2 of 2 positions shown · non-contrast
Comparison: None.

CLINICAL DATA: Chest pain

EXAM:
CHEST - 2 VIEW

[w chest pa]
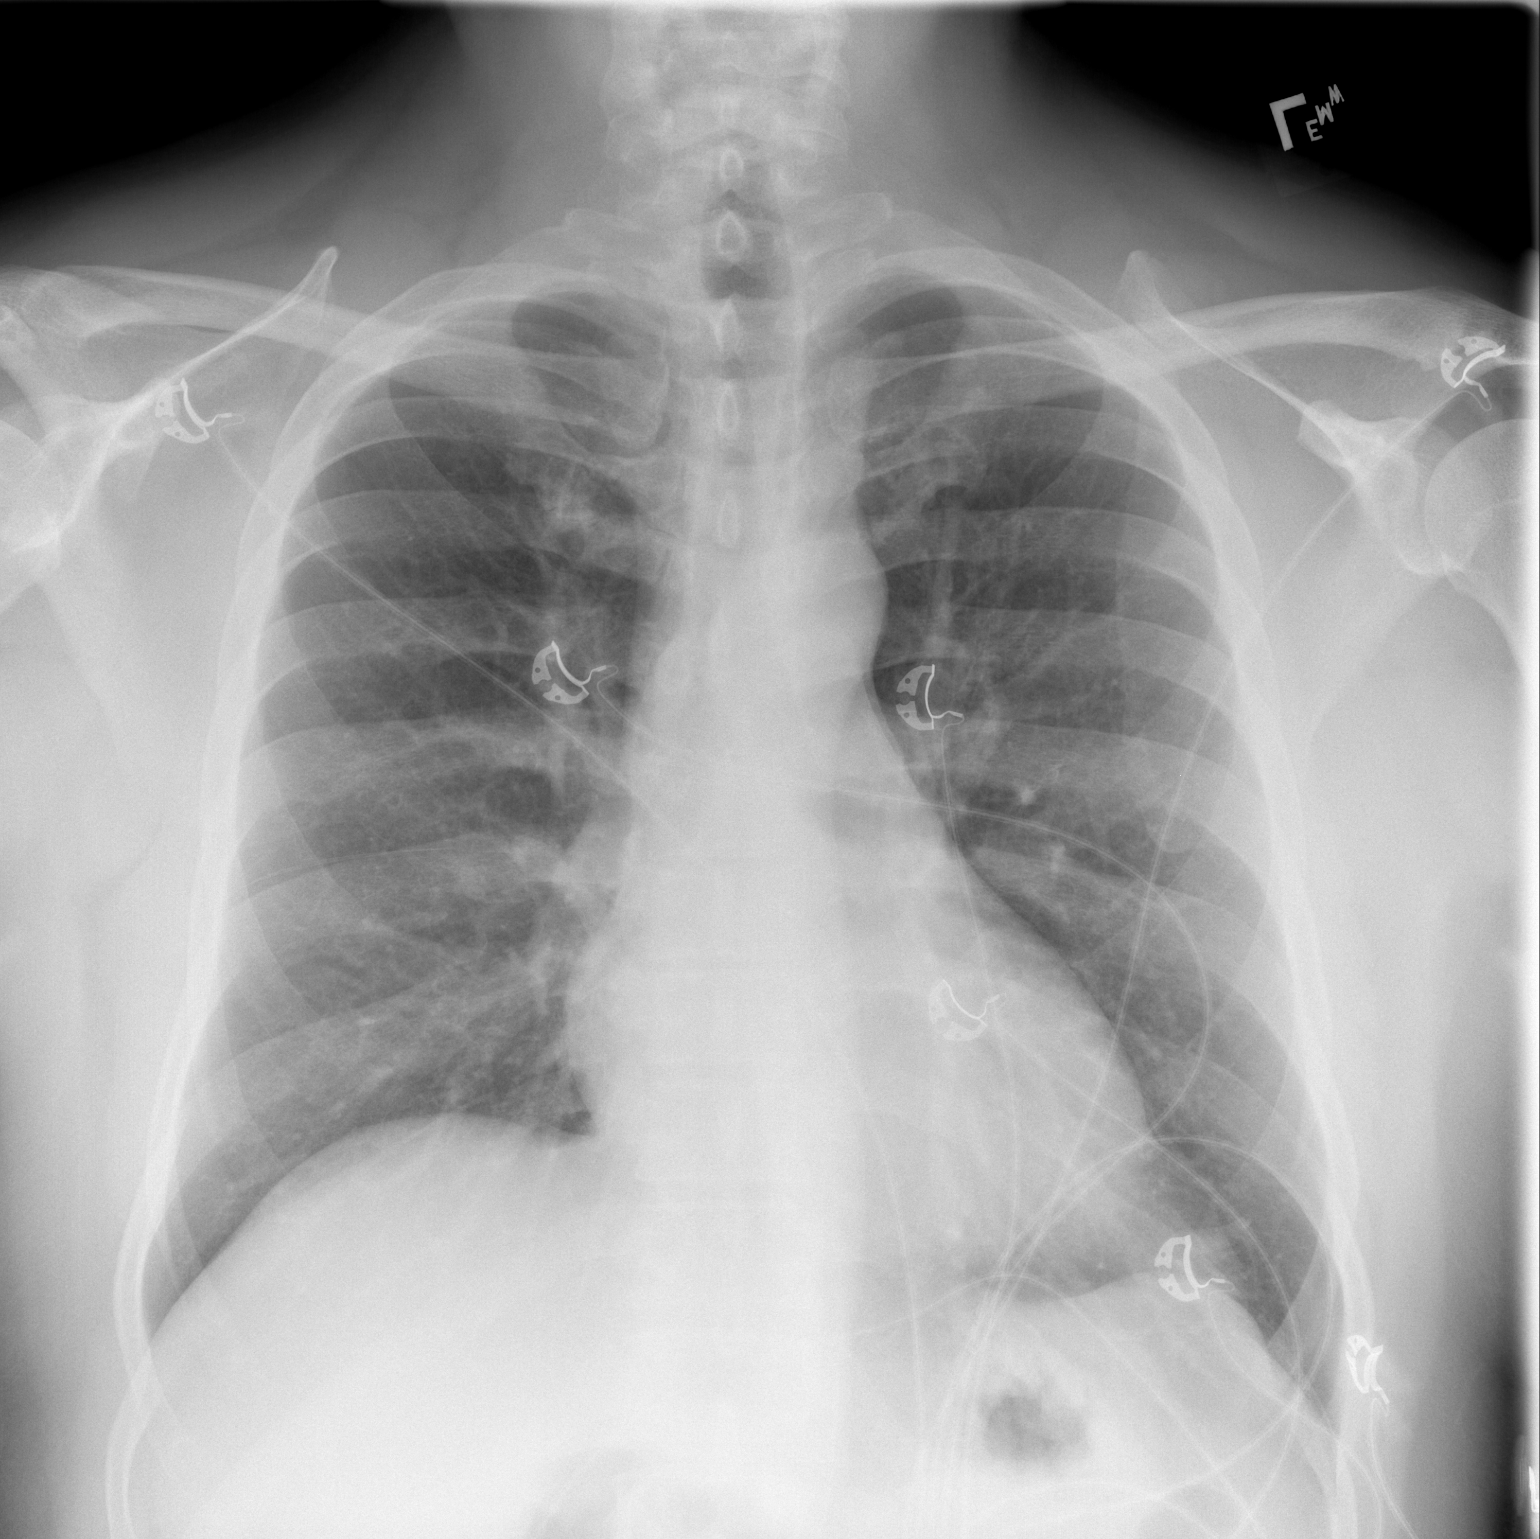

[w chest lat]
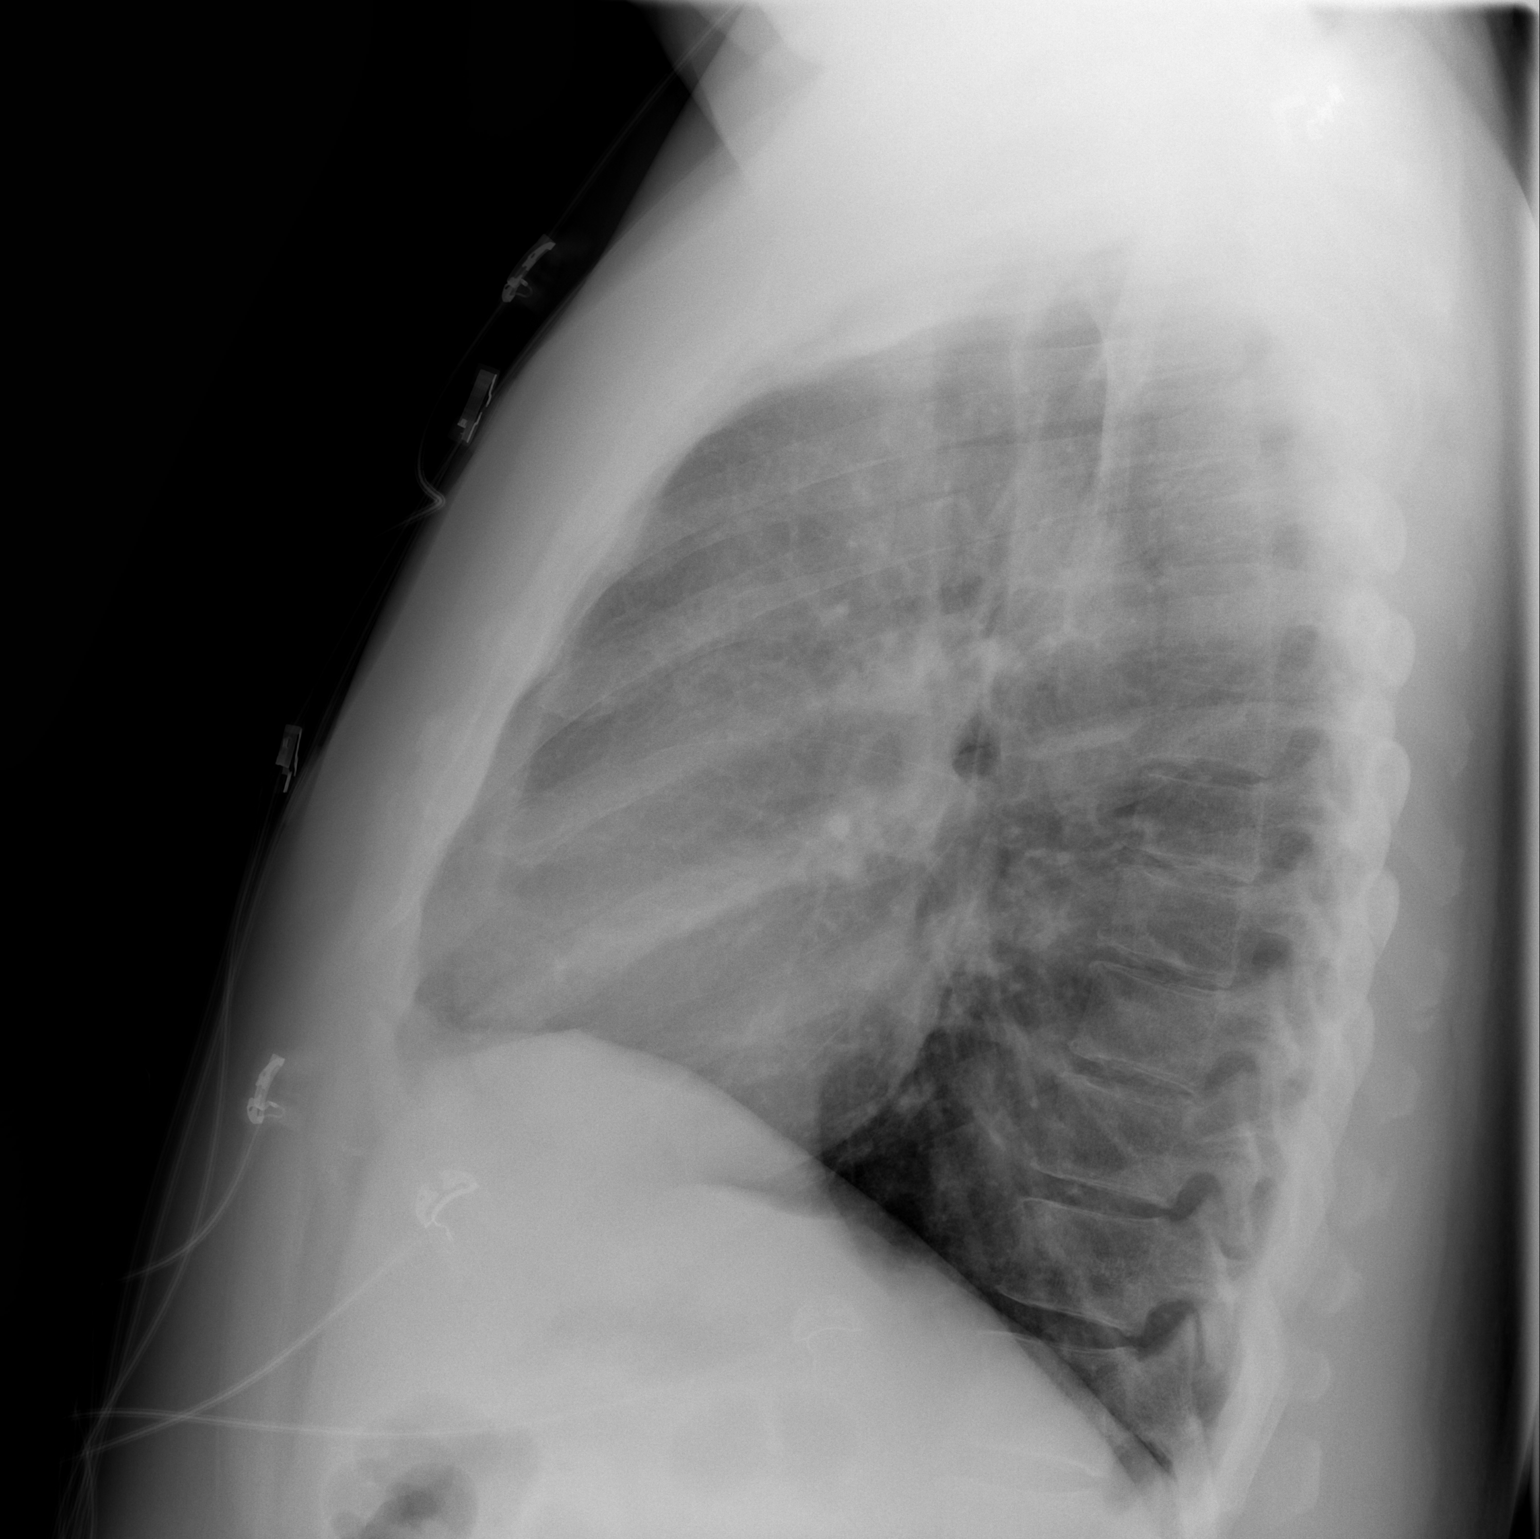

[2 of 2 positions shown; findings below may reference images not displayed]

FINDINGS: The heart size and mediastinal contours are within normal limits.
Both lungs are clear. The visualized skeletal structures are
unremarkable.
IMPRESSION: No active cardiopulmonary disease.

## 2021-10-17 ENCOUNTER — Ambulatory Visit: Payer: No Typology Code available for payment source | Admitting: Internal Medicine

## 2022-02-11 ENCOUNTER — Ambulatory Visit: Payer: Self-pay | Admitting: Internal Medicine

## 2022-03-03 NOTE — Progress Notes (Signed)
? ?Cardiology Clinic Note  ? ?Patient Name: Colin Stewart ?Date of Encounter: 03/06/2022 ? ?Primary Care Provider:  [facility] ?Primary Cardiologist:  Chrystie Nose, MD ? ?Patient Profile  ?  ?Kole Hilyard 51 year old male presents to the clinic today for follow-up evaluation of his coronary artery disease and hypertension. ? ?Past Medical History  ?  ?Past Medical History:  ?Diagnosis Date  ? Anxiety   ? Enlarged heart   ? PTSD (post-traumatic stress disorder)   ? Rotator cuff injury   ? Sleep apnea   ? ?Past Surgical History:  ?Procedure Laterality Date  ? LEFT HEART CATH AND CORONARY ANGIOGRAPHY N/A 07/11/2020  ? Procedure: LEFT HEART CATH AND CORONARY ANGIOGRAPHY;  Surgeon: Lyn Records, MD;  Location: [facility] INVASIVE CV LAB;  Service: Cardiovascular;  Laterality: N/A;  ? ? ?Allergies ? ?No Known Allergies ? ?History of Present Illness  ?  ?Hendrick Pavich has a PMH of coronary artery disease status post NSTEMI 8/21, HTN, OSA on CPAP, hyperlipidemia, and prediabetes.  He underwent cardiac catheterization 07/11/2020 which showed an occluded nondominant RCA, 85% LAD lesion, and a moderate luminal irregularities as left circumflex.  He underwent PCI and received DES x2 to his LAD.  Given his lack of chest discomfort he did not receive intervention to his RCA.  His PMH also includes PTSD. ? ?He was seen by Dr. Rennis Golden 10/30/2020.  During that time he remained stable from a cardiac standpoint.  His cholesterol was well controlled.  LDL was 58 with a total cholesterol 113. ? ?He was seen by Bailey Mech, DNP on 07/27/2021.  During that time he had no cardiac complaints.  He was walking 1.5 miles per day.  He reported medical compliance.  He struggled with his CPAP but reported that he was wearing it regularly.  He discussed that he would like to stop his DOAC.  He indicated that he did not like to take a lot of pills.  He denied significant bleeding and bruising.  He discussed some dental disease that  needed to be addressed.  He wanted to proceed with intervention.  His Brilinta was stopped and he was continuing on 81 mg aspirin. ? ?He presents to the clinic today for follow-up evaluation and states he feels well.  He continues to walk with his wife.  He is not walking as regularly because he is working more.  He does notice some right thigh pain when he gets to 1.5 miles.  The pain goes away as he continues to walk.  He also has noticed some left arm pain with his new job which appears to be musculoskeletal.  We reviewed his previous catheterization and lipid panel.  I will have him increase his physical activity as tolerated, give a salty 6 diet sheet, continue his current medication regimen, and repeat his lipids in August.  We will plan follow-up for 1 year. ? ?Today he denies chest pain, shortness of breath, lower extremity edema, fatigue, palpitations, melena, hematuria, hemoptysis, diaphoresis, weakness, presyncope, syncope, orthopnea, and PND. ? ? ?Home Medications  ?  ?Prior to Admission medications   ?Medication Sig Start Date End Date Taking? Authorizing Provider  ?aspirin 81 MG chewable tablet Chew 1 tablet (81 mg total) by mouth daily. 07/12/20   Corrin Parker, PA-C  ?atorvastatin (LIPITOR) 80 MG tablet Take 1 tablet (80 mg total) by mouth at bedtime. 07/27/21   Chrystie Nose, MD  ?carvedilol (COREG) 6.25 MG tablet Take 1 tablet (6.25 mg total)  by mouth 2 (two) times daily. 07/27/21   Chrystie NoseHilty, Kenneth C, MD  ?ezetimibe (ZETIA) 10 MG tablet Take 1 tablet (10 mg total) by mouth daily. 07/27/21 10/25/21  Chrystie NoseHilty, Kenneth C, MD  ?nitroGLYCERIN (NITROSTAT) 0.4 MG SL tablet Place 1 tablet (0.4 mg total) under the tongue every 5 (five) minutes as needed for chest pain. 07/12/20 07/27/21  Corrin ParkerGoodrich, Callie E, PA-C  ? ? ?Family History  ?  ?Family History  ?Problem Relation Age of Onset  ? CAD Mother   ? Hypertension Father   ? ?He indicated that his mother is deceased. He indicated that his father is  deceased. ? ?Social History  ?  ?Social History  ? ?Socioeconomic History  ? Marital status: Married  ?  Spouse name: Not on file  ? Number of children: Not on file  ? Years of education: Not on file  ? Highest education level: Not on file  ?Occupational History  ? Not on file  ?Tobacco Use  ? Smoking status: Some Days  ?  Types: Cigars  ? Smokeless tobacco: Never  ?Vaping Use  ? Vaping Use: Never used  ?Substance and Sexual Activity  ? Alcohol use: Yes  ?  Alcohol/week: 6.0 standard drinks  ?  Types: 6 Cans of beer per week  ? Drug use: Yes  ?  Frequency: 1.0 times per week  ?  Types: Marijuana  ? Sexual activity: Not on file  ?Other Topics Concern  ? Not on file  ?Social History Narrative  ? Not on file  ? ?Social Determinants of Health  ? ?Financial Resource Strain: Not on file  ?Food Insecurity: Not on file  ?Transportation Needs: Not on file  ?Physical Activity: Not on file  ?Stress: Not on file  ?Social Connections: Not on file  ?Intimate Partner Violence: Not on file  ?  ? ?Review of Systems  ?  ?General:  No chills, fever, night sweats or weight changes.  ?Cardiovascular:  No chest pain, dyspnea on exertion, edema, orthopnea, palpitations, paroxysmal nocturnal dyspnea. ?Dermatological: No rash, lesions/masses ?Respiratory: No cough, dyspnea ?Urologic: No hematuria, dysuria ?Abdominal:   No nausea, vomiting, diarrhea, bright red blood per rectum, melena, or hematemesis ?Neurologic:  No visual changes, wkns, changes in mental status. ?All other systems reviewed and are otherwise negative except as noted above. ? ?Physical Exam  ?  ?VS:  BP 136/82   Pulse (!) 56   Ht 5\' 10"  (1.778 m)   Wt 271 lb 12.8 oz (123.3 kg)   SpO2 96%   BMI 39.00 kg/m?  , BMI Body mass index is 39 kg/m?. ?GEN: Well nourished, well developed, in no acute distress. ?HEENT: normal. ?Neck: Supple, no JVD, carotid bruits, or masses. ?Cardiac: RRR, no murmurs, rubs, or gallops. No clubbing, cyanosis, edema.  Radials/DP/PT 2+ and equal  bilaterally.  ?Respiratory:  Respirations regular and unlabored, clear to auscultation bilaterally. ?GI: Soft, nontender, nondistended, BS + x 4. ?MS: no deformity or atrophy. ?Skin: warm and dry, no rash. ?Neuro:  Strength and sensation are intact. ?Psych: Normal affect. ? ?Accessory Clinical Findings  ?  ?Recent Labs: ?07/27/2021: ALT 24; BUN 15; Creatinine, Ser 0.97; Hemoglobin 14.0; Platelets 197; Potassium 4.7; Sodium 144  ? ?Recent Lipid Panel ?   ?Component Value Date/Time  ? CHOL 119 07/27/2021 0931  ? TRIG 57 07/27/2021 0931  ? HDL 46 07/27/2021 0931  ? CHOLHDL 2.6 07/27/2021 0931  ? CHOLHDL 5.8 07/11/2020 0204  ? VLDL 13 07/11/2020 0204  ? LDLCALC  60 07/27/2021 0931  ? ? ?ECG personally reviewed by me today-sinus bradycardia 56 bpm- No acute changes ? ?Echocardiogram 07/11/2020 ?IMPRESSIONS  ? ? ? 1. Left ventricular ejection fraction, by estimation, is 60 to 65%. The  ?left ventricle has normal function. The left ventricle has no regional  ?wall motion abnormalities. Left ventricular diastolic parameters were  ?normal.  ? 2. Right ventricular systolic function is normal. The right ventricular  ?size is normal. There is normal pulmonary artery systolic pressure.  ? 3. Left atrial size was mildly dilated.  ? 4. The mitral valve is normal in structure. No evidence of mitral valve  ?regurgitation. No evidence of mitral stenosis.  ? 5. The aortic valve is normal in structure. Aortic valve regurgitation is  ?not visualized. No aortic stenosis is present.  ? 6. The inferior vena cava is normal in size with greater than 50%  ?respiratory variability, suggesting right atrial pressure of 3 mmHg. ? ?Cardiac catheterization 07/11/2020 ?A stent was successfully placed. ?  ?Recent occlusion of nondominant right coronary.  At the time of angiography the patient was asymptomatic and having no chest pain.  This lesion was felt to represent an OAT (occluded artery trial) substrate and was not treated. ?Widely patent left  main, relatively short. ?Large, transapical LAD with an acute angle origin from the left main and further tortuosity and angulation in the proximal and mid vessel.  After an acute bend there is a segmental region of 85% ste

## 2022-03-06 ENCOUNTER — Ambulatory Visit (INDEPENDENT_AMBULATORY_CARE_PROVIDER_SITE_OTHER): Payer: No Typology Code available for payment source | Admitting: General Practice

## 2022-03-06 ENCOUNTER — Encounter: Payer: Self-pay | Admitting: General Practice

## 2022-03-06 VITALS — BP 136/82 | HR 56 | Ht 70.0 in | Wt 271.8 lb

## 2022-03-06 DIAGNOSIS — Z79899 Other long term (current) drug therapy: Secondary | ICD-10-CM

## 2022-03-06 DIAGNOSIS — I2510 Atherosclerotic heart disease of native coronary artery without angina pectoris: Secondary | ICD-10-CM

## 2022-03-06 DIAGNOSIS — E7800 Pure hypercholesterolemia, unspecified: Secondary | ICD-10-CM | POA: Diagnosis not present

## 2022-03-06 DIAGNOSIS — G4733 Obstructive sleep apnea (adult) (pediatric): Secondary | ICD-10-CM | POA: Diagnosis not present

## 2022-03-06 DIAGNOSIS — I10 Essential (primary) hypertension: Secondary | ICD-10-CM | POA: Diagnosis not present

## 2022-03-06 DIAGNOSIS — Z9989 Dependence on other enabling machines and devices: Secondary | ICD-10-CM

## 2022-03-06 NOTE — Patient Instructions (Signed)
Medication Instructions:  ?The current medical regimen is effective;  continue present plan and medications as directed. Please refer to the Current Medication list given to you today.  ? ?*If you need a refill on your cardiac medications before your next appointment, please call your pharmacy* ? ?Lab Work:     ?FASTING LIPID AND LFT IN AUGUST ? ?If you have labs (blood work) drawn today and your tests are completely normal, you will receive your results only by:  MyChart Message (if you have MyChart) OR A paper copy in the mail.  If you have any lab test that is abnormal or we need to change your treatment, we will call you to review the results. You may go to any [facility] that is convenient for you however, we do have a lab in our office that is able to assist you. You DO NOT need an appointment for our lab. The lab is open 8:00am and closes at 4:00pm. Lunch 12:45 - 1:45pm.     ? ?Special Instructions ?PLEASE READ AND FOLLOW SALTY 6-ATTACHED-1,800mg  daily ? ?PLEASE INCREASE PHYSICAL ACTIVITY AS TOLERATED  ? ?Follow-Up: ?Your next appointment:  12 month(s) In Person with Chrystie Nose, MD  ? ?Please call our office 2 months in advance to schedule this appointment  :1 ? ?At [facility], you and your health needs are our priority.  As part of our continuing mission to provide you with exceptional heart care, we have created designated Provider Care Teams.  These Care Teams include your primary Cardiologist (physician) and Advanced Practice Providers (APPs -  Physician Assistants and Nurse Practitioners) who all work together to provide you with the care you need, when you need it. ? ? ? ?        6 SALTY THINGS TO AVOID     1,800MG  DAILY ? ? ? ? ? ? ?

## 2022-04-25 ENCOUNTER — Telehealth: Payer: Self-pay | Admitting: Internal Medicine

## 2022-04-25 NOTE — Telephone Encounter (Signed)
Bree from [place] billing called with the patient's wife stating a coverage request needs to be submitted to [facility] for the patient's 1 year follow up due in April on 2024 to be covered. The contact information left is for the community care department who should be contacted regarding this. Please advise.

## 2023-04-16 ENCOUNTER — Encounter: Payer: Self-pay | Admitting: General Practice

## 2023-04-16 ENCOUNTER — Ambulatory Visit: Payer: No Typology Code available for payment source | Attending: General Practice | Admitting: Cardiology

## 2023-04-16 VITALS — BP 132/84 | HR 62 | Ht 70.0 in | Wt 289.2 lb

## 2023-04-16 DIAGNOSIS — E785 Hyperlipidemia, unspecified: Secondary | ICD-10-CM

## 2023-04-16 DIAGNOSIS — G4733 Obstructive sleep apnea (adult) (pediatric): Secondary | ICD-10-CM | POA: Diagnosis not present

## 2023-04-16 DIAGNOSIS — I2510 Atherosclerotic heart disease of native coronary artery without angina pectoris: Secondary | ICD-10-CM

## 2023-04-16 DIAGNOSIS — R030 Elevated blood-pressure reading, without diagnosis of hypertension: Secondary | ICD-10-CM | POA: Diagnosis not present

## 2023-04-16 MED ORDER — NITROGLYCERIN 0.4 MG SL SUBL: 0.4000 mg | SUBLINGUAL_TABLET | SUBLINGUAL | 2 refills | Status: AC | PRN

## 2023-04-16 NOTE — Progress Notes (Unsigned)
Cardiology Office Note:    Date:  04/16/2023   ID:  Colin Stewart, DOB [birth year], MRN [id number]  PCP:  [facility] Providers Cardiologist:  Chrystie Nose, MD { Click to update primary MD,subspecialty MD or APP then REFRESH:1}   Referring MD: [facility], [facility]   Chief Complaint:  No chief complaint on file. {Click here for Visit Info    :1}   Patient Profile:  CAD NSTEMI 07/11/20. Occluded nondominant RCA, 85% LAD lesion, moderate luminal irregularities in LCX. Received DES x2 to LAD. No PCI to RCA given lack of angina. Hypertension OSA on CPAP Hyperlipidemia  Cardiac Studies & Procedures   CARDIAC CATHETERIZATION  CARDIAC CATHETERIZATION 07/11/2020  Narrative  A stent was successfully placed.   Recent occlusion of nondominant right coronary.  At the time of angiography the patient was asymptomatic and having no chest pain.  This lesion was felt to represent an OAT (occluded artery trial) substrate and was not treated.  Widely patent left main, relatively short.  Large, transapical LAD with an acute angle origin from the left main and further tortuosity and angulation in the proximal and mid vessel.  After an acute bend there is a segmental region of 85% stenosis.  The LAD then wraps around the left ventricular apex.  The circumflex is dominant and contains moderate luminal irregularities diffusely.  The PDA arises from the distal circumflex.  No collaterals are seen to fill the right coronary.  Overlapping 30 x 3.0 and 8 mm x 3.0 Onyx stents postdilated to 3.5 mm reduced the 85% stenosis to less than 20% with TIMI grade III flow.  Normal LV function with EF 65%.  LVEDP normal at 16.  RECOMMENDATIONS:   Dual antiplatelet therapy for 1 year due to stent length and overlap.  Aspirin and Brilinta is preferred for at least the initial 3 months.  Downgrading to aspirin and clopidogrel could be a consideration thereafter.   Aggressive risk factor modification including checking hemoglobin A1c, aggressive blood pressure control, and LDL lowering to less than 70.  Eligible for discharge in a.m. if no recurrent symptoms.  Findings Coronary Findings Diagnostic  Dominance: Left  Left Anterior Descending Mid LAD-1 lesion is 85% stenosed. Mid LAD-2 lesion is 70% stenosed.  Second Diagonal Branch Vessel is small in size. 2nd Diag lesion is 80% stenosed.  Right Coronary Artery Prox RCA lesion is 100% stenosed.  Intervention  Mid LAD-1 lesion Stent A stent was successfully placed. Stent (Also treats lesions: Mid LAD-2) A stent was successfully placed. Post-Intervention Lesion Assessment The intervention was successful. There is a 15% residual stenosis post intervention.  Mid LAD-2 lesion Stent (Also treats lesions: Mid LAD-1) See details in Mid LAD-1 lesion. Post-Intervention Lesion Assessment The intervention was successful. There is a 0% residual stenosis post intervention.     ECHOCARDIOGRAM  ECHOCARDIOGRAM COMPLETE 07/11/2020  Narrative ECHOCARDIOGRAM REPORT    Patient Name:   Colin Stewart Date of Exam: 07/11/2020 Medical Rec #:  [id number]      Height:       70.0 in Accession #:    [id number]     Weight:       267.4 lb Date of Birth:  02-19-1971      BSA:          2.362 m Patient Age:    52 years       BP:           146/105 mmHg Patient Gender: M  HR:           66 bpm. Exam Location:  Inpatient  Procedure: 2D Echo and Intracardiac Opacification Agent  Indications:    Chest Pain R07.9  History:        Patient has no prior history of Echocardiogram examinations.  Sonographer:    Thurman Coyer RDCS (AE) Referring Phys: [id number] Beatriz Stallion  IMPRESSIONS   1. Left ventricular ejection fraction, by estimation, is 60 to 65%. The left ventricle has normal function. The left ventricle has no regional wall motion abnormalities. Left ventricular diastolic parameters  were normal. 2. Right ventricular systolic function is normal. The right ventricular size is normal. There is normal pulmonary artery systolic pressure. 3. Left atrial size was mildly dilated. 4. The mitral valve is normal in structure. No evidence of mitral valve regurgitation. No evidence of mitral stenosis. 5. The aortic valve is normal in structure. Aortic valve regurgitation is not visualized. No aortic stenosis is present. 6. The inferior vena cava is normal in size with greater than 50% respiratory variability, suggesting right atrial pressure of 3 mmHg.  FINDINGS Left Ventricle: Left ventricular ejection fraction, by estimation, is 60 to 65%. The left ventricle has normal function. The left ventricle has no regional wall motion abnormalities. Definity contrast agent was given IV to delineate the left ventricular endocardial borders. The left ventricular internal cavity size was normal in size. There is no left ventricular hypertrophy. Left ventricular diastolic parameters were normal. Normal left ventricular filling pressure.  Right Ventricle: The right ventricular size is normal. No increase in right ventricular wall thickness. Right ventricular systolic function is normal. There is normal pulmonary artery systolic pressure. The tricuspid regurgitant velocity is 1.55 m/s, and with an assumed right atrial pressure of 3 mmHg, the estimated right ventricular systolic pressure is 12.6 mmHg.  Left Atrium: Left atrial size was mildly dilated.  Right Atrium: Right atrial size was normal in size.  Pericardium: There is no evidence of pericardial effusion.  Mitral Valve: The mitral valve is normal in structure. Normal mobility of the mitral valve leaflets. No evidence of mitral valve regurgitation. No evidence of mitral valve stenosis.  Tricuspid Valve: The tricuspid valve is normal in structure. Tricuspid valve regurgitation is trivial. No evidence of tricuspid stenosis.  Aortic Valve: The  aortic valve is normal in structure. Aortic valve regurgitation is not visualized. No aortic stenosis is present.  Pulmonic Valve: The pulmonic valve was normal in structure. Pulmonic valve regurgitation is not visualized. No evidence of pulmonic stenosis.  Aorta: The aortic root is normal in size and structure.  Venous: The inferior vena cava is normal in size with greater than 50% respiratory variability, suggesting right atrial pressure of 3 mmHg.  IAS/Shunts: No atrial level shunt detected by color flow Doppler.   LEFT VENTRICLE PLAX 2D LVIDd:         4.90 cm  Diastology LVIDs:         3.40 cm  LV e' lateral:   11.20 cm/s LV PW:         1.10 cm  LV E/e' lateral: 5.0 LV IVS:        1.10 cm  LV e' medial:    7.83 cm/s LVOT diam:     2.20 cm  LV E/e' medial:  7.2 LV SV:         91 LV SV Index:   39 LVOT Area:     3.80 cm   RIGHT VENTRICLE RV S prime:  7.83 cm/s TAPSE (M-mode): 1.7 cm  LEFT ATRIUM             Index       RIGHT ATRIUM           Index LA diam:        4.20 cm 1.78 cm/m  RA Area:     19.70 cm LA Vol (A2C):   78.7 ml 33.33 ml/m RA Volume:   60.70 ml  25.70 ml/m LA Vol (A4C):   43.0 ml 18.21 ml/m LA Biplane Vol: 63.0 ml 26.68 ml/m AORTIC VALVE LVOT Vmax:   123.00 cm/s LVOT Vmean:  84.900 cm/s LVOT VTI:    0.240 m  AORTA Ao Root diam: 3.50 cm  MITRAL VALVE               TRICUSPID VALVE MV Area (PHT): 3.42 cm    TR Peak grad:   9.6 mmHg MV Decel Time: 222 msec    TR Vmax:        155.00 cm/s MV E velocity: 56.10 cm/s MV A velocity: 70.30 cm/s  SHUNTS MV E/A ratio:  0.80        Systemic VTI:  0.24 m Systemic Diam: 2.20 cm  Rachelle Hora Croitoru MD Electronically signed by Thurmon Fair MD Signature Date/Time: 07/11/2020/1:20:23 PM    Final              History of Present Illness:   Biaggio Kostura is a 52 y.o. male with the above problem list.  He presents today for one year follow up. Patient last seen 03/06/22 by Edd Fabian, NP. At that time,  patient reported that he felt well. He noted decreased physical activity due to increased working hours. Since then, patient's hours have decreased and he has more free time to exercise. In the last year, patient reports that he has developed frequent burning discomfort in his legs and and arms. Lower extremity symptoms are brought on by walking/standing and relieved with rest. Upper extremity symptoms occur with use of arms/holding arms above head. Per patient, [facility] are concerned about spinal issues and there are tentative plans for MRI evaluation. Patient has significant claustrophobia and he made attempts to find open MRI. Per his stent MRI guidelines, these open machines are incompatible with his coronary stents/haven't been tested. Discussed with patient that sedation with traditional MRI would be an appropriate alternative and that standard supine MRI often results in clearer images. He denies chest pain, shortness of breath, lower extremity edema, fatigue, palpitations, melena, hematuria, hemoptysis, diaphoresis, weakness, presyncope, syncope, orthopnea, and PND. Patient has not needed any nitroglycerin and denies experiencing any symptoms like the chest pressure felt with prior NSTEMI. From a cardiovascular perspective, I see no reason why patient would not tolerate sedation for spinal MRI.   With regards to sleep apnea, patient reports nightly compliance with CPAP. He reports intermittent poor sleep but says that this is not a result of poorly fitting mask but rather due to anxiety/PTSD.  With regards to BP, patient reports home readings are typically 120-118mmHg.   Patient has regular labs check by his PCP and most recent LDL as of early April was 77.      Past Medical History:  Diagnosis Date   Anxiety    Enlarged heart    PTSD (post-traumatic stress disorder)    Rotator cuff injury    Sleep apnea    Current Medications: Current Meds  Medication Sig   aspirin 81 MG chewable  tablet Chew 1 tablet (81 mg total) by mouth daily.   atorvastatin (LIPITOR) 80 MG tablet Take 1 tablet (80 mg total) by mouth at bedtime.   azelastine (ASTELIN) 0.1 % nasal spray Place 2 sprays into both nostrils 2 (two) times daily.   buPROPion (WELLBUTRIN XL) 150 MG 24 hr tablet Take 150 mg by mouth daily.   carvedilol (COREG) 6.25 MG tablet Take 1 tablet (6.25 mg total) by mouth 2 (two) times daily.   celecoxib (CELEBREX) 200 MG capsule Take 200 mg by mouth. PRN   cyanocobalamin 2000 MCG tablet Take 2,000 mcg by mouth daily.   diclofenac Sodium (VOLTAREN) 1 % GEL Apply 2 g topically 4 (four) times daily.   ezetimibe (ZETIA) 10 MG tablet Take 1 tablet (10 mg total) by mouth daily.   fexofenadine (ALLEGRA) 180 MG tablet Take 180 mg by mouth daily.   hydrOXYzine (ATARAX) 10 MG tablet Take 10 mg by mouth at bedtime as needed.   lidocaine (LIDODERM) 5 % Place 1 patch onto the skin daily. PRN   montelukast (SINGULAIR) 10 MG tablet Take 10 mg by mouth at bedtime.   Multiple Vitamin (MULTIVITAMIN) tablet Take 1 tablet by mouth daily.   [DISCONTINUED] nitroGLYCERIN (NITROSTAT) 0.4 MG SL tablet Place 1 tablet (0.4 mg total) under the tongue every 5 (five) minutes as needed for chest pain.    Allergies:   Patient has no known allergies.   Social History   Occupational History   Not on file  Tobacco Use   Smoking status: Former    Types: Cigars   Smokeless tobacco: Never  Vaping Use   Vaping Use: Never used  Substance and Sexual Activity   Alcohol use: Yes    Alcohol/week: 6.0 standard drinks of alcohol    Types: 6 Cans of beer per week   Drug use: Yes    Frequency: 1.0 times per week    Types: Marijuana   Sexual activity: Not on file    Family Hx: The patient's family history includes CAD in his mother; Hypertension in his father.  Review of Systems  Constitutional: Negative for weight gain and weight loss.  Cardiovascular:  Negative for chest pain, dyspnea on exertion, irregular  heartbeat, leg swelling, orthopnea, palpitations and syncope.  Respiratory:  Negative for cough, shortness of breath and snoring.   Musculoskeletal:  Positive for back pain.       Upper and lower extremity burning with movement/use  All other systems reviewed and are negative.    EKGs/Labs/Other Test Reviewed:    EKG:  EKG is ordered today.  The ekg ordered today demonstrates normal sinus rhythm.   Recent Labs: No results found for requested labs within last 365 days.   Recent Lipid Panel No results for input(s): "CHOL", "TRIG", "HDL", "VLDL", "LDLCALC", "LDLDIRECT" in the last 8760 hours.    Risk Assessment/Calculations/Metrics:              Physical Exam:    VS:  BP 132/84   Pulse 62   Ht 5\' 10"  (1.778 m)   Wt 289 lb 3.2 oz (131.2 kg)   SpO2 95%   BMI 41.50 kg/m     Wt Readings from Last 3 Encounters:  04/16/23 289 lb 3.2 oz (131.2 kg)  03/06/22 271 lb 12.8 oz (123.3 kg)  07/27/21 273 lb 12.8 oz (124.2 kg)    Constitutional:      Appearance: Healthy appearance. Not in distress.  Neck:     Vascular: JVD normal.  Pulmonary:     Effort: Pulmonary effort is normal.     Breath sounds: Normal breath sounds. No wheezing. No rhonchi. No rales.  Chest:     Chest wall: Not tender to palpatation.  Cardiovascular:     PMI at left midclavicular line. Normal rate. Regular rhythm. Normal S1. Normal S2.      Murmurs: There is no murmur.     No gallop.  No click. No rub.  Pulses:    Intact distal pulses.  Edema:    Peripheral edema absent.  Abdominal:     General: Bowel sounds are normal.     Palpations: Abdomen is soft.     Tenderness: There is no abdominal tenderness.  Musculoskeletal: Normal range of motion.        General: No tenderness. Skin:    General: Skin is warm and dry.  Neurological:     General: No focal deficit present.     Mental Status: Alert and oriented to person, place and time.         ASSESSMENT & PLAN:   OSA on CPAP Continue nightly use of  CPAP. Weight loss encouraged.  CAD (coronary artery disease) Patient s/p PCI to LAD in 2021 remains physically active and is without exertional chest discomfort or shortness of breath. Continue aspirin 81mg , atorvastatin 80mg , carvedilol, ezetimibe, nitroglycerin as needed. Encouraged him to increase physical activity as able.  Hyperlipidemia [facility] LDL with [facility] in April 2024 at goal, 69. Will check fasting lipid panel. Continue atorvastatin, ezetimibe.   Blood pressure elevated without history of HTN BP borderline at goal, 132/84 today. Patient reports better BP at home, says that he's almost never over 130. He appears anxious at today's appointment and I suspect that BP is elevated as a result. He was previously on a higher dose of Coreg but experienced orthostatic symptoms and is reluctant to change dose. Will continue Coreg 6.25mg  BID but patient asked to keep careful track of BP and alert [place] to chronically elevated numbers.             Dispo:  No follow-ups on file.   Medication Adjustments/Labs and Tests Ordered: Current medicines are reviewed at length with the patient today.  Concerns regarding medicines are outlined above.  Tests Ordered: Orders Placed This Encounter  Procedures   EKG 12-Lead   Medication Changes: Meds ordered this encounter  Medications   nitroGLYCERIN (NITROSTAT) 0.4 MG SL tablet    Sig: Place 1 tablet (0.4 mg total) under the tongue every 5 (five) minutes as needed for chest pain.    Dispense:  25 tablet    Refill:  2   Signed, Perlie Gold, PA-C  04/16/2023 5:14 PM    [facility] [address] [place], [place], [place]  [address] Phone: [phone]; Fax: [phone]

## 2023-04-16 NOTE — Assessment & Plan Note (Addendum)
Patient s/p PCI to LAD in 2021 remains physically active and is without exertional chest discomfort or shortness of breath. Continue aspirin 81mg , atorvastatin 80mg , carvedilol, ezetimibe, nitroglycerin as needed. Encouraged him to increase physical activity as able.

## 2023-04-16 NOTE — Assessment & Plan Note (Signed)
BP borderline at goal, 132/84 today. Patient reports better BP at home, says that he's almost never over 130. He appears anxious at today's appointment and I suspect that BP is elevated as a result. He was previously on a higher dose of Coreg but experienced orthostatic symptoms and is reluctant to change dose. Will continue Coreg 6.25mg  BID but patient asked to keep careful track of BP and alert [place] to chronically elevated numbers.

## 2023-04-16 NOTE — Assessment & Plan Note (Signed)
Continue nightly use of CPAP. Weight loss encouraged.

## 2023-04-16 NOTE — Assessment & Plan Note (Addendum)
[facility] LDL [facility] in April 2024 at goal, 18. Will check fasting lipid panel. Continue atorvastatin, ezetimibe.

## 2023-04-16 NOTE — Patient Instructions (Signed)
Medication Instructions:  No Changes *If you need a refill on your cardiac medications before your next appointment, please call your pharmacy*   Lab Work: No labs If you have labs (blood work) drawn today and your tests are completely normal, you will receive your results only by: MyChart Message (if you have MyChart) OR A paper copy in the mail If you have any lab test that is abnormal or we need to change your treatment, we will call you to review the results.   Testing/Procedures: No Testing   Follow-Up: At [facility], you and your health needs are our priority.  As part of our continuing mission to provide you with exceptional heart care, we have created designated Provider Care Teams.  These Care Teams include your primary Cardiologist (physician) and Advanced Practice Providers (APPs -  Physician Assistants and Nurse Practitioners) who all work together to provide you with the care you need, when you need it.  We recommend signing up for the patient portal called "MyChart".  Sign up information is provided on this After Visit Summary.  MyChart is used to connect with patients for Virtual Visits (Telemedicine).  Patients are able to view lab/test results, encounter notes, upcoming appointments, etc.  Non-urgent messages can be sent to your provider as well.   To learn more about what you can do with MyChart, go to [email or web address].    Your next appointment:   1 year(s)  Provider:   Chrystie Nose, MD

## 2023-04-17 ENCOUNTER — Encounter: Payer: Self-pay | Admitting: Cardiology

## 2023-10-01 ENCOUNTER — Ambulatory Visit (INDEPENDENT_AMBULATORY_CARE_PROVIDER_SITE_OTHER): Payer: No Typology Code available for payment source | Admitting: Orthopedic Surgery

## 2023-10-01 ENCOUNTER — Other Ambulatory Visit (INDEPENDENT_AMBULATORY_CARE_PROVIDER_SITE_OTHER): Payer: No Typology Code available for payment source

## 2023-10-01 ENCOUNTER — Encounter: Payer: Self-pay | Admitting: Orthopedic Surgery

## 2023-10-01 VITALS — BP 126/86 | HR 63 | Ht 71.0 in | Wt 281.0 lb

## 2023-10-01 DIAGNOSIS — M545 Low back pain, unspecified: Secondary | ICD-10-CM | POA: Diagnosis not present

## 2023-10-01 MED ORDER — PREGABALIN 75 MG PO CAPS: 75.0000 mg | ORAL_CAPSULE | Freq: Two times a day (BID) | ORAL | 0 refills | Status: DC

## 2023-10-01 NOTE — Progress Notes (Signed)
Orthopedic Spine Surgery Office Note  Assessment: Patient is a 52 y.o. male with low back pain that radiates into bilateral lower extremities.  On the right, radiates in the right anterior thigh.  On the left side, radiates down the posterior aspect of the extremity.  Suspect radiculopathy   Plan: -Explained that initially conservative treatment is tried as a significant number of patients may experience relief with these treatment modalities. Discussed that the conservative treatments include:  -activity modification  -physical therapy  -over the counter pain medications  -medrol dosepak  -lumbar steroid injections -Patient has tried Tylenol, Celebrex -Since he has had symptoms for several years without any relief, recommended MRI of the lumbar spine to evaluate for radiculopathy.  Patient did not want to pursue that at this time.  I told him he could call and we can order that in the future if he changes mind -I prescribed him Lyrica to help with pain -Patient should return to office on an as-needed basis  Patient expressed understanding of the plan and all questions were answered to the patient's satisfaction.   ___________________________________________________________________________   History:  Patient is a 52 y.o. male who presents today for lumbar spine.  Patient has had a multi year history of low back pain that radiates into the bilateral lower extremities.  He said that this dates back to at least the early 2000's.  He feels the pain in his lower back.  On the right side, radiates into the anterior thigh.  He feels it when he is standing.  His thigh pain gets better if he bends the knee.  On the left side, his pain radiates down the posterior aspect of the extremity.  He feels it in the buttock, posterior thigh, posterior leg.  He says that this pain is worse to be sitting for a long time but it gets milder in nature if he stands.  He said there was no specific trauma or injury  that brought on the pain but was in the [employer] for a long period of time where he was doing very physical work.   Weakness: Denies Symptoms of imbalance: Denies Paresthesias and numbness: Denies Bowel or bladder incontinence: Denies Saddle anesthesia: Denies  Treatments tried: Tylenol, Celebrex  Review of systems: Denies fevers and chills, night sweats, unexplained weight loss, history of cancer.  Has had pain that wakes him at night  Past medical history: HLD HTN CAD History of MI Migraines Depression/anxiety Irritable bowel syndrome PTSD GERD OSA Chronic pain Claustrophobia Plantar fasciitis Osteoarthritis Tinnitus  Allergies: NKDA  Past surgical history:  Coronary stent placement  Social history: Denies use of nicotine product (smoking, vaping, patches, smokeless) Alcohol use: Yes, approximately 6 drinks per week Denies recreational drug use   Physical Exam:  BMI of 39.2  General: no acute distress, appears stated age Neurologic: alert, answering questions appropriately, following commands Respiratory: unlabored breathing on room air, symmetric chest rise Psychiatric: appropriate affect, normal cadence to speech   MSK (spine):  -Strength exam      Left  Right EHL    5/5  5/5 TA    5/5  5/5 GSC    5/5  5/5 Knee extension  5/5  5/5 Hip flexion   5/5  5/5  -Sensory exam    Sensation intact to light touch in L3-S1 nerve distributions of bilateral lower extremities  -Achilles DTR: 2/4 on the left, 2/4 on the right -Patellar tendon DTR: 2/4 on the left, 2/4 on the right  -Straight  leg raise: Negative bilaterally -Femoral nerve stretch test: Negative bilaterally -Clonus: no beats bilaterally  -Left hip exam: Pain in his lower back with FADIR, no pain through remainder of range of motion, negative FABER, negative Stinchfield -Right hip exam: Pain in his lower back with FADIR, no pain through remainder of range of motion, negative FABER, negative  Stinchfield  Imaging: XRs of the lumbar spine from 10/01/2023 was independently reviewed and interpreted, showing disc height loss at L3/4.  No other significant degenerative changes.  No fracture or dislocation seen.  No evidence of instability on flexion/extension views.  CT of the lumbar spine (on disc) from 09/01/2023 was independently reviewed and interpreted, showing facet arthropathy at L5/S1 and L3/4.  No fracture or dislocation seen.   Patient name: Colin Stewart Patient MRN: [id number] Date of visit: 10/01/23

## 2023-10-06 ENCOUNTER — Telehealth: Payer: Self-pay | Admitting: Orthopedic Surgery

## 2023-10-06 NOTE — Telephone Encounter (Signed)
Patient called and need a note to give to the [place] to prove he was seen here. CB#[phone]

## 2023-10-06 NOTE — Telephone Encounter (Signed)
Mailed AVS to patients home address.

## 2023-11-24 ENCOUNTER — Telehealth: Payer: Self-pay | Admitting: Orthopedic Surgery

## 2023-11-24 NOTE — Telephone Encounter (Signed)
Pt's wife called requesting a refill of pregablin to [facility]. She states she has called the [place] and they informed them that since Dr Christell Constant is the prescribe it needs to come from him. Explained to pt Dr Christell Constant is in surgery and asked him to call them when the script have be sent to [facility]. Pt pt phone number is [phone].

## 2023-11-27 MED ORDER — PREGABALIN 75 MG PO CAPS: 75.0000 mg | ORAL_CAPSULE | Freq: Two times a day (BID) | ORAL | 2 refills | Status: DC

## 2023-11-27 NOTE — Telephone Encounter (Signed)
I called and advised  rx was sent to the [facility]

## 2024-03-22 ENCOUNTER — Other Ambulatory Visit: Payer: Self-pay | Admitting: Radiology

## 2024-03-22 MED ORDER — PREGABALIN 75 MG PO CAPS: 75.0000 mg | ORAL_CAPSULE | Freq: Two times a day (BID) | ORAL | 2 refills | Status: DC

## 2024-03-24 ENCOUNTER — Other Ambulatory Visit: Payer: Self-pay | Admitting: Radiology

## 2024-03-24 MED ORDER — PREGABALIN 75 MG PO CAPS
75.0000 mg | ORAL_CAPSULE | Freq: Two times a day (BID) | ORAL | 2 refills | Status: AC
Start: 2024-03-24 — End: 2024-06-22

## 2024-09-20 ENCOUNTER — Other Ambulatory Visit: Payer: Self-pay | Admitting: Radiology

## 2024-10-04 ENCOUNTER — Encounter: Payer: Self-pay | Admitting: Radiology
